# Patient Record
Sex: Female | Born: 1990 | Race: White | Hispanic: No | Marital: Single | Smoking: Never smoker
Health system: Southern US, Community
[De-identification: ages and names within clinical notes are randomized; demographics above are authoritative.]

## PROBLEM LIST (undated history)

## (undated) DIAGNOSIS — E119 Type 2 diabetes mellitus without complications: Secondary | ICD-10-CM

## (undated) DIAGNOSIS — F329 Major depressive disorder, single episode, unspecified: Secondary | ICD-10-CM

## (undated) DIAGNOSIS — F419 Anxiety disorder, unspecified: Secondary | ICD-10-CM

## (undated) DIAGNOSIS — F32A Depression, unspecified: Secondary | ICD-10-CM

## (undated) DIAGNOSIS — G43909 Migraine, unspecified, not intractable, without status migrainosus: Secondary | ICD-10-CM

## (undated) DIAGNOSIS — N809 Endometriosis, unspecified: Secondary | ICD-10-CM

## (undated) HISTORY — DX: Major depressive disorder, single episode, unspecified: F32.9

## (undated) HISTORY — DX: Migraine, unspecified, not intractable, without status migrainosus: G43.909

## (undated) HISTORY — PX: INTRAUTERINE DEVICE (IUD) INSERTION: SHX5877

## (undated) HISTORY — DX: Type 2 diabetes mellitus without complications: E11.9

## (undated) HISTORY — DX: Anxiety disorder, unspecified: F41.9

## (undated) HISTORY — DX: Depression, unspecified: F32.A

## (undated) HISTORY — PX: WISDOM TOOTH EXTRACTION: SHX21

---

## 2013-08-23 ENCOUNTER — Emergency Department (HOSPITAL_COMMUNITY)
Admission: EM | Admit: 2013-08-23 | Discharge: 2013-08-23 | Disposition: A | Discharge status: Home / Self Care | Payer: 59 | Attending: Emergency Medicine | Admitting: Emergency Medicine

## 2013-08-23 ENCOUNTER — Encounter (HOSPITAL_COMMUNITY): Payer: Self-pay | Admitting: Emergency Medicine

## 2013-08-23 DIAGNOSIS — E119 Type 2 diabetes mellitus without complications: Secondary | ICD-10-CM | POA: Insufficient documentation

## 2013-08-23 DIAGNOSIS — Z8719 Personal history of other diseases of the digestive system: Secondary | ICD-10-CM | POA: Insufficient documentation

## 2013-08-23 DIAGNOSIS — R52 Pain, unspecified: Secondary | ICD-10-CM | POA: Insufficient documentation

## 2013-08-23 DIAGNOSIS — R631 Polydipsia: Secondary | ICD-10-CM | POA: Insufficient documentation

## 2013-08-23 DIAGNOSIS — Z3202 Encounter for pregnancy test, result negative: Secondary | ICD-10-CM | POA: Insufficient documentation

## 2013-08-23 DIAGNOSIS — R112 Nausea with vomiting, unspecified: Secondary | ICD-10-CM | POA: Insufficient documentation

## 2013-08-23 DIAGNOSIS — R197 Diarrhea, unspecified: Secondary | ICD-10-CM | POA: Insufficient documentation

## 2013-08-23 DIAGNOSIS — Z79899 Other long term (current) drug therapy: Secondary | ICD-10-CM | POA: Insufficient documentation

## 2013-08-23 LAB — COMPREHENSIVE METABOLIC PANEL
ALK PHOS: 52 U/L (ref 39–117)
ALT: 80 U/L — ABNORMAL HIGH (ref 0–35)
AST: 61 U/L — AB (ref 0–37)
Albumin: 3.5 g/dL (ref 3.5–5.2)
BILIRUBIN TOTAL: 0.4 mg/dL (ref 0.3–1.2)
BUN: 9 mg/dL (ref 6–23)
CHLORIDE: 103 meq/L (ref 96–112)
CO2: 24 mEq/L (ref 19–32)
Calcium: 9 mg/dL (ref 8.4–10.5)
Creatinine, Ser: 0.65 mg/dL (ref 0.50–1.10)
GFR calc Af Amer: >90 mL/min (ref >90)
GFR calc non Af Amer: >90 mL/min (ref >90)
Glucose, Bld: 255 mg/dL — ABNORMAL HIGH (ref 70–99)
POTASSIUM: 3.7 meq/L (ref 3.7–5.3)
Sodium: 140 mEq/L (ref 137–147)
Total Protein: 7.8 g/dL (ref 6.0–8.3)

## 2013-08-23 LAB — CBC WITH DIFFERENTIAL/PLATELET
BASOS ABS: 0 10*3/uL (ref 0.0–0.1)
Basophils Relative: 1 % (ref 0–1)
Eosinophils Absolute: 0 10*3/uL (ref 0.0–0.7)
Eosinophils Relative: 1 % (ref 0–5)
HCT: 41.5 % (ref 36.0–46.0)
Hemoglobin: 14.7 g/dL (ref 12.0–15.0)
Lymphocytes Relative: 50 % — ABNORMAL HIGH (ref 12–46)
Lymphs Abs: 1.6 10*3/uL (ref 0.7–4.0)
MCH: 30.7 pg (ref 26.0–34.0)
MCHC: 35.4 g/dL (ref 30.0–36.0)
MCV: 86.6 fL (ref 78.0–100.0)
MONOS PCT: 12 % (ref 3–12)
Monocytes Absolute: 0.4 10*3/uL (ref 0.1–1.0)
Neutro Abs: 1.2 10*3/uL — ABNORMAL LOW (ref 1.7–7.7)
Neutrophils Relative %: 36 % — ABNORMAL LOW (ref 43–77)
PLATELETS: 204 10*3/uL (ref 150–400)
RBC: 4.79 MIL/uL (ref 3.87–5.11)
RDW: 12.2 % (ref 11.5–15.5)
WBC: 3.2 10*3/uL — ABNORMAL LOW (ref 4.0–10.5)

## 2013-08-23 LAB — URINALYSIS, ROUTINE W REFLEX MICROSCOPIC
BILIRUBIN URINE: NEGATIVE
Glucose, UA: NEGATIVE mg/dL
Hgb urine dipstick: NEGATIVE
KETONES UR: NEGATIVE mg/dL
LEUKOCYTES UA: NEGATIVE
Nitrite: NEGATIVE
PH: 5.5 (ref 5.0–8.0)
Protein, ur: NEGATIVE mg/dL
Specific Gravity, Urine: 1.018 (ref 1.005–1.030)
Urobilinogen, UA: 0.2 mg/dL (ref 0.0–1.0)

## 2013-08-23 LAB — GLUCOSE, CAPILLARY: Glucose-Capillary: 153 mg/dL — ABNORMAL HIGH (ref 70–99)

## 2013-08-23 LAB — LIPASE, BLOOD: Lipase: 21 U/L (ref 11–59)

## 2013-08-23 LAB — POCT PREGNANCY, URINE: Preg Test, Ur: NEGATIVE

## 2013-08-23 MED ORDER — ONDANSETRON HCL 4 MG PO TABS: 4.0000 mg | ORAL_TABLET | Freq: Four times a day (QID) | ORAL | Status: DC

## 2013-08-23 MED ORDER — ONDANSETRON HCL 4 MG/2ML IJ SOLN
4.0000 mg | Freq: Once | INTRAMUSCULAR | Status: AC
  Administered 2013-08-23: 4 mg via INTRAVENOUS
  Filled 2013-08-23: qty 2

## 2013-08-23 MED ORDER — SODIUM CHLORIDE 0.9 % IV BOLUS (SEPSIS)
1000.0000 mL | Freq: Once | INTRAVENOUS | Status: AC
  Administered 2013-08-23: 1000 mL via INTRAVENOUS

## 2013-08-23 NOTE — ED Notes (Signed)
Bed: [id number] Expected date:  Expected time:  Means of arrival:  Comments: For [place]

## 2013-08-23 NOTE — Discharge Instructions (Signed)
Diabetes and Exercise Exercising regularly is important. It is not just about losing weight. It has many health benefits, such as:  Improving your overall fitness, flexibility, and endurance.  Increasing your bone density.  Helping with weight control.  Decreasing your body fat.  Increasing your muscle strength.  Reducing stress and tension.  Improving your overall health. People with diabetes who exercise gain additional benefits because exercise:  Reduces appetite.  Improves the body's use of blood sugar (glucose).  Helps lower or control blood glucose.  Decreases blood pressure.  Helps control blood lipids (such as cholesterol and triglycerides).  Improves the body's use of the hormone insulin by:  Increasing the body's insulin sensitivity.  Reducing the body's insulin needs.  Decreases the risk for heart disease because exercising:  Lowers cholesterol and triglycerides levels.  Increases the levels of good cholesterol (such as high-density lipoproteins [HDL]) in the body.  Lowers blood glucose levels. YOUR ACTIVITY PLAN  Choose an activity that you enjoy and set realistic goals. Your health care provider or diabetes educator can help you make an activity plan that works for you. You can break activities into 2 or 3 sessions throughout the day. Doing so is as good as one long session. Exercise ideas include:  Taking the dog for a walk.  Taking the stairs instead of the elevator.  Dancing to your favorite song.  Doing your favorite exercise with a friend. RECOMMENDATIONS FOR EXERCISING WITH TYPE 1 OR TYPE 2 DIABETES   Check your blood glucose before exercising. If blood glucose levels are greater than 240 mg/dL, check for urine ketones. Do not exercise if ketones are present.  Avoid injecting insulin into areas of the body that are going to be exercised. For example, avoid injecting insulin into:  The arms when playing tennis.  The legs when  jogging.  Keep a record of:  Food intake before and after you exercise.  Expected peak times of insulin action.  Blood glucose levels before and after you exercise.  The type and amount of exercise you have done.  Review your records with your health care provider. Your health care provider will help you to develop guidelines for adjusting food intake and insulin amounts before and after exercising.  If you take insulin or oral hypoglycemic agents, watch for signs and symptoms of hypoglycemia. They include:  Dizziness.  Shaking.  Sweating.  Chills.  Confusion.  Drink plenty of water while you exercise to prevent dehydration or heat stroke. Body water is lost during exercise and must be replaced.  Talk to your health care provider before starting an exercise program to make sure it is safe for you. Remember, almost any type of activity is better than none. Document Released: 09/22/2003 Document Revised: 03/04/2013 Document Reviewed: 12/09/2012 [facility] Patient Information 2014 [place].  Hyperglycemia Hyperglycemia occurs when the glucose (sugar) in your blood is too high. Hyperglycemia can happen for many reasons, but it most often happens to people who do not know they have diabetes or are not managing their diabetes properly.  CAUSES  Whether you have diabetes or not, there are other causes of hyperglycemia. Hyperglycemia can occur when you have diabetes, but it can also occur in other situations that you might not be as aware of, such as: Diabetes  If you have diabetes and are having problems controlling your blood glucose, hyperglycemia could occur because of some of the following reasons:  Not following your meal plan.  Not taking your diabetes medications or not  taking it properly.  Exercising less or doing less activity than you normally do.  Being sick. Pre-diabetes  This cannot be ignored. Before people develop Type 2 diabetes, they almost always have  "pre-diabetes." This is when your blood glucose levels are higher than normal, but not yet high enough to be diagnosed as diabetes. Research has shown that some long-term damage to the body, especially the heart and circulatory system, may already be occurring during pre-diabetes. If you take action to manage your blood glucose when you have pre-diabetes, you may delay or prevent Type 2 diabetes from developing. Stress  If you have diabetes, you may be "diet" controlled or on oral medications or insulin to control your diabetes. However, you may find that your blood glucose is higher than usual in the hospital whether you have diabetes or not. This is often referred to as "stress hyperglycemia." Stress can elevate your blood glucose. This happens because of hormones put out by the body during times of stress. If stress has been the cause of your high blood glucose, it can be followed regularly by your caregiver. That way he/she can make sure your hyperglycemia does not continue to get worse or progress to diabetes. Steroids  Steroids are medications that act on the infection fighting system (immune system) to block inflammation or infection. One side effect can be a rise in blood glucose. Most people can produce enough extra insulin to allow for this rise, but for those who cannot, steroids make blood glucose levels go even higher. It is not unusual for steroid treatments to "uncover" diabetes that is developing. It is not always possible to determine if the hyperglycemia will go away after the steroids are stopped. A special blood test called an A1c is sometimes done to determine if your blood glucose was elevated before the steroids were started. SYMPTOMS  Thirsty.  Frequent urination.  Dry mouth.  Blurred vision.  Tired or fatigue.  Weakness.  Sleepy.  Tingling in feet or leg. DIAGNOSIS  Diagnosis is made by monitoring blood glucose in one or all of the following ways:  A1c test. This  is a chemical found in your blood.  Fingerstick blood glucose monitoring.  Laboratory results. TREATMENT  First, knowing the cause of the hyperglycemia is important before the hyperglycemia can be treated. Treatment may include, but is not be limited to:  Education.  Change or adjustment in medications.  Change or adjustment in meal plan.  Treatment for an illness, infection, etc.  More frequent blood glucose monitoring.  Change in exercise plan.  Decreasing or stopping steroids.  Lifestyle changes. HOME CARE INSTRUCTIONS   Test your blood glucose as directed.  Exercise regularly. Your caregiver will give you instructions about exercise. Pre-diabetes or diabetes which comes on with stress is helped by exercising.  Eat wholesome, balanced meals. Eat often and at regular, fixed times. Your caregiver or nutritionist will give you a meal plan to guide your sugar intake.  Being at an ideal weight is important. If needed, losing as little as 10 to 15 pounds may help improve blood glucose levels. SEEK MEDICAL CARE IF:   You have questions about medicine, activity, or diet.  You continue to have symptoms (problems such as increased thirst, urination, or weight gain). SEEK IMMEDIATE MEDICAL CARE IF:   You are vomiting or have diarrhea.  Your breath smells fruity.  You are breathing faster or slower.  You are very sleepy or incoherent.  You have numbness, tingling, or pain in  your feet or hands.  You have chest pain.  Your symptoms get worse even though you have been following your caregiver's orders.  If you have any other questions or concerns. Document Released: 12/26/2000 Document Revised: 09/24/2011 Document Reviewed: 10/29/2011 [facility] Patient Information 2014 [place], [place].  Nausea and Vomiting Nausea means you feel sick to your stomach. Throwing up (vomiting) is a reflex where stomach contents come out of your mouth. HOME CARE   Take medicine as told by  your doctor.  Do not force yourself to eat. However, you do need to drink fluids.  If you feel like eating, eat a normal diet as told by your doctor.  Eat rice, wheat, potatoes, bread, lean meats, yogurt, fruits, and vegetables.  Avoid high-fat foods.  Drink enough fluids to keep your pee (urine) clear or pale yellow.  Ask your doctor how to replace body fluid losses (rehydrate). Signs of body fluid loss (dehydration) include:  Feeling very thirsty.  Dry lips and mouth.  Feeling dizzy.  Dark pee.  Peeing less than normal.  Feeling confused.  Fast breathing or heart rate. GET HELP RIGHT AWAY IF:   You have blood in your throw up.  You have black or bloody poop (stool).  You have a bad headache or stiff neck.  You feel confused.  You have bad belly (abdominal) pain.  You have chest pain or trouble breathing.  You do not pee at least once every 8 hours.  You have cold, clammy skin.  You keep throwing up after 24 to 48 hours.  You have a fever. MAKE SURE YOU:   Understand these instructions.  Will watch your condition.  Will get help right away if you are not doing well or get worse. Document Released: 12/19/2007 Document Revised: 09/24/2011 Document Reviewed: 12/01/2010 [facility] Patient Information 2014 [place], [place].

## 2013-08-23 NOTE — ED Notes (Signed)
Bedside report received from previous RN, Susan. 

## 2013-08-23 NOTE — ED Provider Notes (Signed)
CSN: [id number]     Arrival date & time 08/23/13  1635 History   First MD Initiated Contact with Patient 08/23/13 1646     Chief Complaint  Patient presents with  . Emesis  . Diarrhea   (Consider location/radiation/quality/duration/timing/severity/associated sxs/prior Treatment) HPI Comments: Patient is a 23 year old female who presents today with one week of nausea, vomiting, diarrhea. She reports that this began on Sunday at that time was associated with body aches and flu like symptoms. [facility] who diagnosed her with a viral gastroenteritis. Since that time the body aches have improved. She is still having the diarrhea, nausea, vomiting. She has abdominal pain relieved by having diarrhea. She has never has sx that have lasted this long in the past. No prior abdominal surgeries. No abnormal vaginal discharge, urinary urgency, frequency, dysuria. She admits to polydipsia, but believes it to be due to the fact she is not keeping anything down. She denies polyuria and polyphagia. No recent travel, suspicious food intake, or recent camping trips. [place].   The history is provided by the patient. No language interpreter was used.    History reviewed. No pertinent past medical history. Past Surgical History  Procedure Laterality Date  . Wisdom tooth extraction     No family history on file. History  Substance Use Topics  . Smoking status: Never Smoker   . Smokeless tobacco: Not on file  . Alcohol Use: No   OB History   Grav Para Term Preterm Abortions TAB SAB Ect Mult Living                 Review of Systems  Constitutional: Negative for fever and chills.  Respiratory: Negative for shortness of breath.   Cardiovascular: Negative for chest pain.  Gastrointestinal: Positive for nausea, vomiting, abdominal pain and diarrhea. Negative for blood in stool.  All other systems reviewed and are  negative.    Allergies  Review of patient's allergies indicates no known allergies.  Home Medications   Current Outpatient Rx  Name  Route  Sig  Dispense  Refill  . bismuth subsalicylate (PEPTO BISMOL) 262 MG/15ML suspension   Oral   Take 30 mLs by mouth every 6 (six) hours as needed.         Marland Kitchen. ibuprofen (ADVIL,MOTRIN) 200 MG tablet   Oral   Take 400 mg by mouth every 6 (six) hours as needed for fever or moderate pain.         Marland Kitchen. Prochlorperazine Maleate (COMPAZINE PO)   Oral   Take 1 tablet by mouth 2 (two) times daily.          BP 125/77  Pulse 88  Temp(Src) 98.5 F (36.9 C) (Oral)  Resp 16  Ht 5\' 6"  (1.676 m)  Wt 245 lb (111.131 kg)  BMI 39.56 kg/m2  SpO2 96%  LMP 08/21/2013 Physical Exam  Nursing note and vitals reviewed. Constitutional: She is oriented to person, place, and time. She appears well-developed and well-nourished. No distress.  HENT:  Head: Normocephalic and atraumatic.  Right Ear: External ear normal.  Left Ear: External ear normal.  Nose: Nose normal.  Mouth/Throat: Oropharynx is clear and moist.  Eyes: Conjunctivae are normal.  Neck: Normal range of motion.  Cardiovascular: Normal rate, regular rhythm and normal heart sounds.   Pulmonary/Chest: Effort normal and breath sounds normal. No stridor. No respiratory distress. She has no wheezes. She has no  rales.  Abdominal: Soft. Bowel sounds are normal. She exhibits no distension. There is no tenderness. There is no rigidity, no rebound and no guarding.  No tenderness to deep palpation  Musculoskeletal: Normal range of motion.  Neurological: She is alert and oriented to person, place, and time. She has normal strength.  Skin: Skin is warm and dry. She is not diaphoretic. No erythema.  Psychiatric: She has a normal mood and affect. Her behavior is normal.    ED Course  Procedures (including critical care time) Labs Review Labs Reviewed  CBC WITH DIFFERENTIAL - Abnormal; Notable for the  following:    WBC 3.2 (*)    Neutrophils Relative % 36 (*)    Lymphocytes Relative 50 (*)    Neutro Abs 1.2 (*)    All other components within normal limits  COMPREHENSIVE METABOLIC PANEL - Abnormal; Notable for the following:    Glucose, Bld 255 (*)    AST 61 (*)    ALT 80 (*)    All other components within normal limits  URINALYSIS, ROUTINE W REFLEX MICROSCOPIC - Abnormal; Notable for the following:    APPearance CLOUDY (*)    All other components within normal limits  GLUCOSE, CAPILLARY - Abnormal; Notable for the following:    Glucose-Capillary 153 (*)    All other components within normal limits  LIPASE, BLOOD  POCT PREGNANCY, URINE   Imaging Review No results found.  EKG Interpretation   None       MDM   1. Diabetes   2. Nausea vomiting and diarrhea    Patient is nontoxic, nonseptic appearing, in no apparent distress.  Patient's pain and other symptoms adequately managed in emergency department.  Fluid bolus given.  Labs, imaging and vitals reviewed.  Patient does not meet the SIRS or Sepsis criteria.  On repeat exam patient does not have a surgical abdomen and there are no peritoneal signs.  No indication of appendicitis, bowel obstruction, bowel perforation, cholecystitis, diverticulitis, PID or ectopic pregnancy.  Pt with glucose of 255. Discussed this diagnosis of diabetes with the patient. Glucose decreased to 153 after fluid bolus. Patient discharged home with symptomatic treatment and given strict instructions for follow-up. She will get a primary care physician. Discussed different options today.  I have also discussed reasons to return immediately to the ER.  Patient expresses understanding and agrees with plan. Discussed case with Dr. Micheline Maze who agrees with plan. Patient / Family / Caregiver informed of clinical course, understand medical decision-making process, and agree with plan.       Mora Bellman, PA-C 08/24/13 [phone]

## 2013-08-23 NOTE — ED Notes (Signed)
Pt presents with NAD- V/D since last Sunday. Fever since last Thursday. However not in the last couple of days. Last episode of vomiting 1 hour ago. 2 episodes of vomiting at that time. Last episode of diarrhea at 5am. 4 episodes of diarrhea in 24 hours. [facility] has has [facility] and was given compazine for nausea. Pepto for other symptoms. Pt has has no improvement. Here for reevaluation.

## 2013-08-24 NOTE — ED Provider Notes (Signed)
Medical screening examination/treatment/procedure(s) were performed by non-physician practitioner and as supervising physician I was immediately available for consultation/collaboration.  EKG Interpretation   None         Shanna CiscoMegan E Docherty, MD 08/24/13 1310

## 2013-10-20 ENCOUNTER — Encounter: Payer: Self-pay | Admitting: Certified Nurse Midwife

## 2013-10-20 ENCOUNTER — Ambulatory Visit (INDEPENDENT_AMBULATORY_CARE_PROVIDER_SITE_OTHER): Payer: 59 | Admitting: Certified Nurse Midwife

## 2013-10-20 VITALS — BP 118/78 | HR 60 | Resp 15 | Ht 65.75 in | Wt 244.0 lb

## 2013-10-20 DIAGNOSIS — G43009 Migraine without aura, not intractable, without status migrainosus: Secondary | ICD-10-CM | POA: Insufficient documentation

## 2013-10-20 DIAGNOSIS — E669 Obesity, unspecified: Secondary | ICD-10-CM

## 2013-10-20 DIAGNOSIS — Z01419 Encounter for gynecological examination (general) (routine) without abnormal findings: Secondary | ICD-10-CM

## 2013-10-20 DIAGNOSIS — Z0000 Encounter for general adult medical examination without abnormal findings: Secondary | ICD-10-CM

## 2013-10-20 NOTE — Progress Notes (Signed)
23 y.o. G0P0000 Single Caucasian Fe here to establish gyn care and  for annual exam. Periods are irregular with Paragard IUD and light first 3 days and then heavy until stops, 4 days later. Chose Paragard due to recommendation, Mirena might affect migraines. Patient happy with choice at this point.. Sexually active no partner change, no STD concerns or testing desired. Sees [facility] for prn use. Last lab work at ER in hospital, all normal except for liver enzymes due to dehydration. Patient experiencing? anxiety attacks again . Previous use of Celexa with good response 3, but  years ago. Denies thoughts of hurting self or others. No other health issues today. Patient works as an Museum/gallery exhibitions officer.   Patient's last menstrual period was 10/20/2013.          Sexually active: yes  The current method of family planning is IUD.    Exercising: no  exercise Smoker:  no  Health Maintenance: Pap:  1/14 No abnormal paps ever MMG:  none Colonoscopy:  none BMD:   none TDaP:  7/14 Labs: none Self breast exam: done monthly   reports that she has never smoked. She does not have any smokeless tobacco history on file. She reports that she does not drink alcohol or use illicit drugs.  Past Medical History  Diagnosis Date  . Migraines   . Anxiety   . Depression   . Diabetes mellitus without complication     Past Surgical History  Procedure Laterality Date  . Wisdom tooth extraction    . Intrauterine device (iud) insertion      paraguard iud inserted 02/2013    Current Outpatient Prescriptions  Medication Sig Dispense Refill  . ibuprofen (ADVIL,MOTRIN) 200 MG tablet Take 400 mg by mouth every 6 (six) hours as needed for fever or moderate pain.      Marland Kitchen loratadine (CLARITIN) 10 MG tablet Take 10 mg by mouth daily.      Marland Kitchen PARAGARD INTRAUTERINE COPPER IU by Intrauterine route.       No current facility-administered medications for this visit.    Family History  Problem Relation Age of Onset  . Diabetes  Mother   . Hypertension Mother   . Bipolar disorder Mother   . Diabetes Brother   . Breast cancer Maternal Aunt   . Diabetes Maternal Grandfather   . Diabetes Paternal Grandfather   . Heart attack Paternal Grandfather   . Breast cancer Maternal Aunt     ROS:  Pertinent items are noted in HPI.  Otherwise, a comprehensive ROS was negative.  Exam:   BP 118/78  Pulse 60  Resp 15  Ht 5' 5.75" (1.67 m)  Wt 244 lb (110.678 kg)  BMI 39.69 kg/m2  LMP 10/20/2013 Height: 5' 5.75" (167 cm)  Ht Readings from Last 3 Encounters:  10/20/13 5' 5.75" (1.67 m)  08/23/13 5\' 6"  (1.676 m)    General appearance: alert, cooperative and appears stated age Head: Normocephalic, without obvious abnormality, atraumatic Neck: no adenopathy, supple, symmetrical, trachea midline and thyroid normal to inspection and palpation and non-palpable Lungs: clear to auscultation bilaterally Breasts: normal appearance, no masses or tenderness, No nipple retraction or dimpling, No nipple discharge or bleeding, No axillary or supraclavicular adenopathy Heart: regular rate and rhythm Abdomen: soft, non-tender; no masses,  no organomegaly Extremities: extremities normal, atraumatic, no cyanosis or edema Skin: Skin color, texture, turgor normal. No rashes or lesions Lymph nodes: Cervical, supraclavicular, and axillary nodes normal. No abnormal inguinal nodes palpated Neurologic: Grossly normal  Pelvic: External genitalia:  no lesions              Urethra:  normal appearing urethra with no masses, tenderness or lesions              Bartholin's and Skene's: normal                 Vagina: normal appearing vagina with normal color and discharge, no lesions              Cervix: normal, non tender, IUD string noted              Pap taken: yes Bimanual Exam:  Uterus:  normal size, contour, position, consistency, mobility, non-tender and anteverted              Adnexa: normal adnexa and no mass, fullness, tenderness                Rectovaginal: Confirms               Anus:  normal sphincter tone, no lesions  A:  Well Woman with normal exam  Contraception Paragard IUD  Obesity  Anxiety with panic attacks  Family history of breast cancer  P:  Reviewed health and wellness pertinent to exam  Aware of warning signs and symptoms with IUD use  Discussed counseling evaluation before considering medication again. Discussed with the work she does, this may be a contributory factor to some of the panic attacks and if medication necessary to make the best choice for her. Patient " thinks this is a great idea", given J.Whitt information to schedule. Patient to call and schedule appointment here if needed after counseling session. If thoughts of self harm or others seek ER or 911. Patient aware.  Patient aware of genetic screening and not sure if it was done with MAunts, will check with mother. Stressed SBE and yearly clinical exam.  Pap smear as per guidelines   pap smear with reflex taken today  counseled on breast self exam, HIV risk factors and prevention, adequate intake of calcium and vitamin D, diet and exercise and weight loss which would increase health benefits. Questions addressed.  return annually or prn  An After Visit Summary was printed and given to the patient.

## 2013-10-20 NOTE — Patient Instructions (Addendum)
General topics  Next pap or exam is  due in 1 year Take a Women's multivitamin Take 1200 mg. of calcium daily - prefer dietary If any concerns in interim to call back  Breast Self-Awareness Practicing breast self-awareness may pick up problems early, prevent significant medical complications, and possibly save your life. By practicing breast self-awareness, you can become familiar with how your breasts look and feel and if your breasts are changing. This allows you to notice changes early. It can also offer you some reassurance that your breast health is good. One way to learn what is normal for your breasts and whether your breasts are changing is to do a breast self-exam. If you find a lump or something that was not present in the past, it is best to contact your caregiver right away. Other findings that should be evaluated by your caregiver include nipple discharge, especially if it is bloody; skin changes or reddening; areas where the skin seems to be pulled in (retracted); or new lumps and bumps. Breast pain is seldom associated with cancer (malignancy), but should also be evaluated by a caregiver. BREAST SELF-EXAM The best time to examine your breasts is 5 7 days after your menstrual period is over.  ExitCare Patient Information 2013 ExitCare[employer].   Exercise to Stay Healthy Exercise helps you become and stay healthy. EXERCISE IDEAS AND TIPS Choose exercises that:  You enjoy.  Fit into your day. You do not need to exercise really hard to be healthy. You can do exercises at a slow or medium level and stay healthy. You can:  Stretch before and after working out.  Try yoga, Pilates, or tai chi.  Lift weights.  Walk fast, swim, jog, run, climb stairs, bicycle, dance, or rollerskate.  Take aerobic classes. Exercises that burn about 150 calories:  Running 1  miles in 15 minutes.  Playing volleyball for 45 to 60 minutes.  Washing and waxing a car for 45 to 60  minutes.  Playing touch football for 45 minutes.  Walking 1  miles in 35 minutes.  Pushing a stroller 1  miles in 30 minutes.  Playing basketball for 30 minutes.  Raking leaves for 30 minutes.  Bicycling 5 miles in 30 minutes.  Walking 2 miles in 30 minutes.  Dancing for 30 minutes.  Shoveling snow for 15 minutes.  Swimming laps for 20 minutes.  Walking up stairs for 15 minutes.  Bicycling 4 miles in 15 minutes.  Gardening for 30 to 45 minutes.  Jumping rope for 15 minutes.  Washing windows or floors for 45 to 60 minutes. Document Released: 08/04/2010 Document Revised: 09/24/2011 Document Reviewed: 08/04/2010 ExitCare Patient Information 2013 ExitCare[employer].   Other topics ( that may be useful information):    Sexually Transmitted Disease Sexually transmitted disease (STD) refers to any infection that is passed from person to person during sexual activity. This may happen by way of saliva, semen, blood, vaginal mucus, or urine. Common STDs include:  Gonorrhea.  Chlamydia.  Syphilis.  HIV/AIDS.  Genital herpes.  Hepatitis B and C.  Trichomonas.  Human papillomavirus (HPV).  Pubic lice. CAUSES  An STD may be spread by bacteria, virus, or parasite. A person can get an STD by:  Sexual intercourse with an infected person.  Sharing sex toys with an infected person.  Sharing needles with an infected person.  Having intimate contact with the genitals, mouth, or rectal areas of an infected person. SYMPTOMS  Some people may not have any symptoms, but   they can still pass the infection to others. Different STDs have different symptoms. Symptoms include:  Painful or bloody urination.  Pain in the pelvis, abdomen, vagina, anus, throat, or eyes.  Skin rash, itching, irritation, growths, or sores (lesions). These usually occur in the genital or anal area.  Abnormal vaginal discharge.  Penile discharge in men.  Soft, flesh-colored skin growths in the  genital or anal area.  Fever.  Pain or bleeding during sexual intercourse.  Swollen glands in the groin area.  Yellow skin and eyes (jaundice). This is seen with hepatitis. DIAGNOSIS  To make a diagnosis, your caregiver may:  Take a medical history.  Perform a physical exam.  Take a specimen (culture) to be examined.  Examine a sample of discharge under a microscope.  Perform blood test TREATMENT   Chlamydia, gonorrhea, trichomonas, and syphilis can be cured with antibiotic medicine.  Genital herpes, hepatitis, and HIV can be treated, but not cured, with prescribed medicines. The medicines will lessen the symptoms.  Genital warts from HPV can be treated with medicine or by freezing, burning (electrocautery), or surgery. Warts may come back.  HPV is a virus and cannot be cured with medicine or surgery.However, abnormal areas may be followed very closely by your caregiver and may be removed from the cervix, vagina, or vulva through office procedures or surgery. If your diagnosis is confirmed, your recent sexual partners need treatment. This is true even if they are symptom-free or have a negative culture or evaluation. They should not have sex until their caregiver says it is okay. HOME CARE INSTRUCTIONS  All sexual partners should be informed, tested, and treated for all STDs.  Take your antibiotics as directed. Finish them even if you start to feel better.  Only take over-the-counter or prescription medicines for pain, discomfort, or fever as directed by your caregiver.  Rest.  Eat a balanced diet and drink enough fluids to keep your urine clear or pale yellow.  Do not have sex until treatment is completed and you have followed up with your caregiver. STDs should be checked after treatment.  Keep all follow-up appointments, Pap tests, and blood tests as directed by your caregiver.  Only use latex condoms and water-soluble lubricants during sexual activity. Do not use  petroleum jelly or oils.  Avoid alcohol and illegal drugs.  Get vaccinated for HPV and hepatitis. If you have not received these vaccines in the past, talk to your caregiver about whether one or both might be right for you.  Avoid risky sex practices that can break the skin. The only way to avoid getting an STD is to avoid all sexual activity.Latex condoms and dental dams (for oral sex) will help lessen the risk of getting an STD, but will not completely eliminate the risk. SEEK MEDICAL CARE IF:   You have a fever.  You have any new or worsening symptoms. Document Released: 09/22/2002 Document Revised: 09/24/2011 Document Reviewed: 09/29/2010 [facility] Patient Information 2013 [place].    Domestic Abuse You are being battered or abused if someone close to you hits, pushes, or physically hurts you in any way. You also are being abused if you are forced into activities. You are being sexually abused if you are forced to have sexual contact of any kind. You are being emotionally abused if you are made to feel worthless or if you are constantly threatened. It is important to remember that help is available. No one has the right to abuse you. PREVENTION OF FURTHER  ABUSE  Learn the warning signs of danger. This varies with situations but may include: the use of alcohol, threats, isolation from friends and family, or forced sexual contact. Leave if you feel that violence is going to occur.  If you are attacked or beaten, report it to the police so the abuse is documented. You do not have to press charges. The police can protect you while you or the attackers are leaving. Get the officer's name and badge number and a copy of the report.  Find someone you can trust and tell them what is happening to you: your caregiver, a nurse, clergy member, close friend or family member. Feeling ashamed is natural, but remember that you have done nothing wrong. No one deserves abuse. Document Released:  06/29/2000 Document Revised: 09/24/2011 Document Reviewed: 09/07/2010 ExitCare Patient Information 2013 [employer]Care[employer].    How Much is Too Much Alcohol? Drinking too much alcohol can cause injury, accidents, and health problems. These types of problems can include:   Car crashes.  Falls.  Family fighting (domestic violence).  Drowning.  Fights.  Injuries.  Burns.  Damage to certain organs.  Having a baby with birth defects. ONE DRINK CAN BE TOO MUCH WHEN YOU ARE:  Working.  Pregnant or breastfeeding.  Taking medicines. Ask your doctor.  Driving or planning to drive. If you or someone you know has a drinking problem, get help from a doctor.  Document Released: 04/28/2009 Document Revised: 09/24/2011 Document Reviewed: 04/28/2009 ExitCare Patient Information 2013 ExitCare[employer].   Smoking Hazards Smoking cigarettes is extremely bad for your health. Tobacco smoke has over 200 known poisons in it. There are over 60 chemicals in tobacco smoke that cause cancer. Some of the chemicals found in cigarette smoke include:   Cyanide.  Benzene.  Formaldehyde.  Methanol (wood alcohol).  Acetylene (fuel used in welding torches).  Ammonia. Cigarette smoke also contains the poisonous gases nitrogen oxide and carbon monoxide.  Cigarette smokers have an increased risk of many serious medical problems and Smoking causes approximately:  90% of all lung cancer deaths in men.  80% of all lung cancer deaths in women.  90% of deaths from chronic obstructive lung disease. Compared with nonsmokers, smoking increases the risk of:  Coronary heart disease by 2 to 4 times.  Stroke by 2 to 4 times.  Men developing lung cancer by 23 times.  Women developing lung cancer by 13 times.  Dying from chronic obstructive lung diseases by 12 times.  . Smoking is the most preventable cause of death and disease in our society.  WHY IS SMOKING ADDICTIVE?  Nicotine is the chemical  agent in tobacco that is capable of causing addiction or dependence.  When you smoke and inhale, nicotine is absorbed rapidly into the bloodstream through your lungs. Nicotine absorbed through the lungs is capable of creating a powerful addiction. Both inhaled and non-inhaled nicotine may be addictive.  Addiction studies of cigarettes and spit tobacco show that addiction to nicotine occurs mainly during the teen years, when young people begin using tobacco products. WHAT ARE THE BENEFITS OF QUITTING?  There are many health benefits to quitting smoking.   Likelihood of developing cancer and heart disease decreases. Health improvements are seen almost immediately.  Blood pressure, pulse rate, and breathing patterns start returning to normal soon after quitting. QUITTING SMOKING   American Lung Association - 1-800-LUNGUSA  American Cancer Society - 1-800-ACS-2345 Document Released: 08/09/2004 Document Revised: 09/24/2011 Document Reviewed: 04/13/2009 ExitCare Patient Information 2013 ExitCare,   LLC.   Stress Management Stress is a state of physical or mental tension that often results from changes in your life or normal routine. Some common causes of stress are:  Death of a loved one.  Injuries or severe illnesses.  Getting fired or changing jobs.  Moving into a new home. Other causes may be:  Sexual problems.  Business or financial losses.  Taking on a large debt.  Regular conflict with someone at home or at work.  Constant tiredness from lack of sleep. It is not just bad things that are stressful. It may be stressful to:  Win the lottery.  Get married.  Buy a new car. The amount of stress that can be easily tolerated varies from person to person. Changes generally cause stress, regardless of the types of change. Too much stress can affect your health. It may lead to physical or emotional problems. Too little stress (boredom) may also become stressful. SUGGESTIONS TO  REDUCE STRESS:  Talk things over with your family and friends. It often is helpful to share your concerns and worries. If you feel your problem is serious, you may want to get help from a professional counselor.  Consider your problems one at a time instead of lumping them all together. Trying to take care of everything at once may seem impossible. List all the things you need to do and then start with the most important one. Set a goal to accomplish 2 or 3 things each day. If you expect to do too many in a single day you will naturally fail, causing you to feel even more stressed.  Do not use alcohol or drugs to relieve stress. Although you may feel better for a short time, they do not remove the problems that caused the stress. They can also be habit forming.  Exercise regularly - at least 3 times per week. Physical exercise can help to relieve that "uptight" feeling and will relax you.  The shortest distance between despair and hope is often a good night's sleep.  Go to bed and get up on time allowing yourself time for appointments without being rushed.  Take a short "time-out" period from any stressful situation that occurs during the day. Close your eyes and take some deep breaths. Starting with the muscles in your face, tense them, hold it for a few seconds, then relax. Repeat this with the muscles in your neck, shoulders, hand, stomach, back and legs.  Take good care of yourself. Eat a balanced diet and get plenty of rest.  Schedule time for having fun. Take a break from your daily routine to relax. HOME CARE INSTRUCTIONS   Call if you feel overwhelmed by your problems and feel you can no longer manage them on your own.  Return immediately if you feel like hurting yourself or someone else. Document Released: 12/26/2000 Document Revised: 09/24/2011 Document Reviewed: 08/18/2007 [facility] Patient Information 2013 [place].  It was great to meet you today.  I look forward to  working with you in the future. Remember to schedule an appointment with me after seeing Almyra Free. Have a great spring.

## 2013-10-21 NOTE — Progress Notes (Signed)
Reviewed personally.  M. Suzanne Jermar Colter, MD.  

## 2013-10-23 LAB — IPS PAP TEST WITH REFLEX TO HPV

## 2013-10-26 ENCOUNTER — Telehealth: Payer: Self-pay | Admitting: Emergency Medicine

## 2013-10-26 NOTE — Telephone Encounter (Signed)
Message left to return call to Makana Rostad at [phone].    

## 2013-10-26 NOTE — Telephone Encounter (Signed)
Spoke with patient. Advised of message from Verner Choleborah S. Leonard CNM. Patient verbalized understanding and will follow up in one year.  Recall entered. Patient is already scheduled for AEX in 09/2014.  Routing to provider for final review. Patient agreeable to disposition. Will close encounter

## 2013-10-26 NOTE — Telephone Encounter (Signed)
Patient returning Tracy's call. °

## 2013-10-26 NOTE — Telephone Encounter (Signed)
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 Very important she keeps this appointment. At age 23 the  HPV may spontaneously clear, that is why a repeat pap in one year is important. Pap recall 08 ------

## 2013-11-03 ENCOUNTER — Ambulatory Visit (INDEPENDENT_AMBULATORY_CARE_PROVIDER_SITE_OTHER): Payer: 59 | Admitting: Endocrinology

## 2013-11-03 ENCOUNTER — Encounter: Payer: Self-pay | Admitting: Endocrinology

## 2013-11-03 VITALS — BP 122/80 | HR 95 | Temp 98.1°F | Ht 65.75 in | Wt 242.0 lb

## 2013-11-03 DIAGNOSIS — K7581 Nonalcoholic steatohepatitis (NASH): Secondary | ICD-10-CM | POA: Insufficient documentation

## 2013-11-03 DIAGNOSIS — K7689 Other specified diseases of liver: Secondary | ICD-10-CM

## 2013-11-03 DIAGNOSIS — E119 Type 2 diabetes mellitus without complications: Secondary | ICD-10-CM | POA: Insufficient documentation

## 2013-11-03 HISTORY — DX: Type 2 diabetes mellitus without complications: E11.9

## 2013-11-03 MED ORDER — CANAGLIFLOZIN 300 MG PO TABS: 1.0000 | ORAL_TABLET | Freq: Every day | ORAL | Status: DC

## 2013-11-03 MED ORDER — PIOGLITAZONE HCL 45 MG PO TABS: 45.0000 mg | ORAL_TABLET | Freq: Every day | ORAL | Status: DC

## 2013-11-03 MED ORDER — METFORMIN HCL ER 500 MG PO TB24: ORAL_TABLET | ORAL | Status: DC

## 2013-11-03 NOTE — Progress Notes (Signed)
Subjective:    Patient ID: Tamara Robles, female    DOB: Dec 08, 1990, 23 y.o.   MRN: [id number]  HPI pt states DM was dx'ed in early 2015, when she presented with a flu-like illness; she has mild if any neuropathy of the lower extremities; he is unaware of any associated chronic complications.  she has never been on insulin.  pt says his diet and exercise are not good.  she has never had severe hypoglycemia or DKA.  She was rx'ed metformin.  She says cbg's are in the 200's.   Past Medical History  Diagnosis Date  . Migraines   . Anxiety   . Depression     Past Surgical History  Procedure Laterality Date  . Wisdom tooth extraction    . Intrauterine device (iud) insertion      paraguard iud inserted 02/2013    History   Social History  . Marital Status: Single    Spouse Name: N/A    Number of Children: N/A  . Years of Education: N/A   Occupational History  . Not on file.   Social History Main Topics  . Smoking status: Never Smoker   . Smokeless tobacco: Never Used  . Alcohol Use: No  . Drug Use: No  . Sexual Activity: Yes    Partners: Male    Birth Control/ Protection: IUD     Comment: paragard   Other Topics Concern  . Not on file   Social History Narrative  . No narrative on file    Current Outpatient Prescriptions on File Prior to Visit  Medication Sig Dispense Refill  . ibuprofen (ADVIL,MOTRIN) 200 MG tablet Take 400 mg by mouth every 6 (six) hours as needed for fever or moderate pain.      Marland Kitchen. loratadine (CLARITIN) 10 MG tablet Take 10 mg by mouth daily.      Marland Kitchen. PARAGARD INTRAUTERINE COPPER IU by Intrauterine route.       No current facility-administered medications on file prior to visit.    No Known Allergies  Family History  Problem Relation Age of Onset  . Diabetes Mother   . Hypertension Mother   . Bipolar disorder Mother   . Diabetes Brother   . Breast cancer Maternal Aunt   . Diabetes Maternal Grandfather   . Diabetes Paternal Grandfather     . Heart attack Paternal Grandfather   . Breast cancer Maternal Aunt     BP 122/80  Pulse 95  Temp(Src) 98.1 F (36.7 C) (Oral)  Ht 5' 5.75" (1.67 m)  Wt 242 lb (109.77 kg)  BMI 39.36 kg/m2  SpO2 96%  LMP 10/20/2013  Review of Systems denies blurry vision, chest pain, sob, n/v, cramps, excessive diaphoresis, memory loss, rhinorrhea, and easy bruising.  She has excessive urination, headache, irregular menses, depression, and weight gain.    Objective:   Physical Exam VS: see vs page GEN: no distress.  Morbid obesity HEAD: head: no deformity eyes: no periorbital swelling, no proptosis external nose and ears are normal mouth: no lesion seen NECK: supple, thyroid is not enlarged CHEST WALL: no deformity LUNGS:  Clear to auscultation CV: reg rate and rhythm, no murmur ABD: abdomen is soft, nontender.  no hepatosplenomegaly.  not distended.  no hernia MUSCULOSKELETAL: muscle bulk and strength are grossly normal.  no obvious joint swelling.  gait is normal and steady PULSES:  no carotid bruit NEURO:  cn 2-12 grossly intact.   readily moves all 4's.  SKIN:  Normal texture  and temperature.  No rash or suspicious lesion is visible.   NODES:  None palpable at the neck PSYCH: alert, well-oriented.  Does not appear anxious nor depressed.     Assessment & Plan:  Type 2 DM: she needs increased rx.  She may need insulin, but she wants to exhaust orals first. Morbid obesity: this complicates the rx of DM. 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

## 2013-11-03 NOTE — Patient Instructions (Addendum)
good diet and exercise habits significanly improve the control of your diabetes.  please let me know if you wish to be referred to a dietician.  high blood sugar is very risky to your health.  you should see an eye doctor every year.  You are at higher than average risk for pneumonia and hepatitis-B.  You should be vaccinated against both.   controlling your blood pressure and cholesterol drastically reduces the damage diabetes does to your body.  this also applies to quitting smoking.  please discuss these with your doctor.  check your blood sugar once a day.  vary the time of day when you check, between before the 3 meals, and at bedtime.  also check if you have symptoms of your blood sugar being too high or too low.  please keep a record of the readings and bring it to your next appointment here.  You can write it on any piece of paper.  please call us sooner if your blood sugar goes below 70, or if you have a lot of readings over 200.   In view of your medical condition, you should avoid pregnancy until we have decided it is safe.   Refer to a weight-loss surgery specialist.  you will receive a phone call, about a day and time for an informational meeting.   i have sent prescriptions to your pharmacy, to add 2 additional medications.   Please come back for a follow-up appointment in 1 month.

## 2013-11-19 ENCOUNTER — Encounter: Payer: Self-pay | Admitting: Endocrinology

## 2013-11-20 ENCOUNTER — Other Ambulatory Visit: Payer: Self-pay | Admitting: Endocrinology

## 2013-11-20 MED ORDER — ONDANSETRON HCL 4 MG PO TABS: 4.0000 mg | ORAL_TABLET | Freq: Three times a day (TID) | ORAL | Status: DC | PRN

## 2013-12-03 ENCOUNTER — Encounter: Payer: Self-pay | Admitting: Endocrinology

## 2013-12-03 ENCOUNTER — Ambulatory Visit (INDEPENDENT_AMBULATORY_CARE_PROVIDER_SITE_OTHER): Payer: 59 | Admitting: Endocrinology

## 2013-12-03 VITALS — BP 114/78 | HR 63 | Temp 97.9°F | Ht 65.75 in | Wt 242.0 lb

## 2013-12-03 DIAGNOSIS — E119 Type 2 diabetes mellitus without complications: Secondary | ICD-10-CM

## 2013-12-03 DIAGNOSIS — Z79899 Other long term (current) drug therapy: Secondary | ICD-10-CM

## 2013-12-03 DIAGNOSIS — K7581 Nonalcoholic steatohepatitis (NASH): Secondary | ICD-10-CM

## 2013-12-03 DIAGNOSIS — K7689 Other specified diseases of liver: Secondary | ICD-10-CM

## 2013-12-03 LAB — BASIC METABOLIC PANEL
BUN: 19 mg/dL (ref 6–23)
CHLORIDE: 107 meq/L (ref 96–112)
CO2: 24 mEq/L (ref 19–32)
Calcium: 9.4 mg/dL (ref 8.4–10.5)
Creatinine, Ser: 0.8 mg/dL (ref 0.4–1.2)
GFR: 94.74 mL/min (ref >60.00)
Glucose, Bld: 103 mg/dL — ABNORMAL HIGH (ref 70–99)
POTASSIUM: 3.8 meq/L (ref 3.5–5.1)
Sodium: 138 mEq/L (ref 135–145)

## 2013-12-03 LAB — HEPATIC FUNCTION PANEL
ALT: 14 U/L (ref 0–35)
AST: 15 U/L (ref 0–37)
Albumin: 3.7 g/dL (ref 3.5–5.2)
Alkaline Phosphatase: 39 U/L (ref 39–117)
BILIRUBIN DIRECT: 0 mg/dL (ref 0.0–0.3)
TOTAL PROTEIN: 7.5 g/dL (ref 6.0–8.3)
Total Bilirubin: 0.3 mg/dL (ref 0.2–1.2)

## 2013-12-03 LAB — HEMOGLOBIN A1C: Hgb A1c MFr Bld: 8.1 % — ABNORMAL HIGH (ref 4.6–6.5)

## 2013-12-03 MED ORDER — BROMOCRIPTINE MESYLATE 2.5 MG PO TABS: 1.2500 mg | ORAL_TABLET | Freq: Every day | ORAL | Status: DC

## 2013-12-03 NOTE — Progress Notes (Signed)
Subjective:    Patient ID: Tamara LarocheJena Robles, female    DOB: 18-Jan-1991, 23 y.o.   MRN: [id number]  HPI pt returns for f/u of type 2 DM (dx'ed in early 2015, when she presented with a flu-like illness; she has mild if any neuropathy of the lower extremities; he is unaware of any associated chronic complications; she has never been on insulin; she has never had severe hypoglycemia or DKA; he was rx'ed metformin and pioglitizone).  no cbg record, but states cbg's are well-controlled.  She reports urinary frequency.  Past Medical History  Diagnosis Date  . Migraines   . Anxiety   . Depression   . Diabetes 11/03/2013    Past Surgical History  Procedure Laterality Date  . Wisdom tooth extraction    . Intrauterine device (iud) insertion      paraguard iud inserted 02/2013    History   Social History  . Marital Status: Single    Spouse Name: N/A    Number of Children: N/A  . Years of Education: N/A   Occupational History  . Not on file.   Social History Main Topics  . Smoking status: Never Smoker   . Smokeless tobacco: Never Used  . Alcohol Use: No  . Drug Use: No  . Sexual Activity: Yes    Partners: Male    Birth Control/ Protection: IUD     Comment: paragard   Other Topics Concern  . Not on file   Social History Narrative  . No narrative on file    Current Outpatient Prescriptions on File Prior to Visit  Medication Sig Dispense Refill  . Canagliflozin (INVOKANA) 300 MG TABS Take 1 tablet (300 mg total) by mouth daily.  90 tablet  3  . ibuprofen (ADVIL,MOTRIN) 200 MG tablet Take 400 mg by mouth every 6 (six) hours as needed for fever or moderate pain.      Marland Kitchen. loratadine (CLARITIN) 10 MG tablet Take 10 mg by mouth daily.      . ondansetron (ZOFRAN) 4 MG tablet Take 1 tablet (4 mg total) by mouth every 8 (eight) hours as needed for nausea or vomiting.  10 tablet  0  . PARAGARD INTRAUTERINE COPPER IU by Intrauterine route.      . pioglitazone (ACTOS) 45 MG tablet Take 1  tablet (45 mg total) by mouth daily.  90 tablet  3   No current facility-administered medications on file prior to visit.    No Known Allergies  Family History  Problem Relation Age of Onset  . Diabetes Mother   . Hypertension Mother   . Bipolar disorder Mother   . Diabetes Brother   . Breast cancer Maternal Aunt   . Diabetes Maternal Grandfather   . Diabetes Paternal Grandfather   . Heart attack Paternal Grandfather   . Breast cancer Maternal Aunt     BP 114/78  Pulse 63  Temp(Src) 97.9 F (36.6 C) (Oral)  Ht 5' 5.75" (1.67 m)  Wt 242 lb (109.77 kg)  BMI 39.36 kg/m2  SpO2 99%   Review of Systems Denies edema.  She has lost a few lbs, due to her efforts.    Objective:   Physical Exam VITAL SIGNS:  See vs page GENERAL: no distress Pulses: dorsalis pedis intact bilat.   Feet: no deformity. normal color and temp.  no edema Skin:  no ulcer on the feet.   Neuro: sensation is intact to touch on the feet    Lab Results  Component  Value Date   HGBA1C 8.1* 12/03/2013   Lab Results  Component Value Date   ALT 14 12/03/2013   AST 15 12/03/2013   ALKPHOS 39 12/03/2013   BILITOT 0.3 12/03/2013      Assessment & Plan:  NASH: better with pioglitizone Morbid obesity: persistent: I did weight-loss surgery letter. DM: moderate exacerbation: we are adding parlodel today.    Patient Instructions  check your blood sugar once a day.  vary the time of day when you check, between before the 3 meals, and at bedtime.  also check if you have symptoms of your blood sugar being too high or too low.  please keep a record of the readings and bring it to your next appointment here.  You can write it on any piece of paper.  please call us sooner if your blood sugar goes below 70, or if you have a lot of readings over 200.   In view of your medical condition, you should avoid pregnancy until we have decided it is safe.   blood tests are being requested for you today.  We'll contact you with  results. Please come back for a follow-up appointment in 3 months.

## 2013-12-03 NOTE — Patient Instructions (Addendum)
check your blood sugar once a day.  vary the time of day when you check, between before the 3 meals, and at bedtime.  also check if you have symptoms of your blood sugar being too high or too low.  please keep a record of the readings and bring it to your next appointment here.  You can write it on any piece of paper.  please call us sooner if your blood sugar goes below 70, or if you have a lot of readings over 200.   In view of your medical condition, you should avoid pregnancy until we have decided it is safe.   blood tests are being requested for you today.  We'll contact you with results. Please come back for a follow-up appointment in 3 months.

## 2013-12-26 ENCOUNTER — Emergency Department (HOSPITAL_COMMUNITY)
Admission: EM | Admit: 2013-12-26 | Discharge: 2013-12-26 | Disposition: A | Discharge status: Home / Self Care | Payer: 59 | Attending: Emergency Medicine | Admitting: Emergency Medicine

## 2013-12-26 ENCOUNTER — Encounter (HOSPITAL_COMMUNITY): Payer: Self-pay | Admitting: Emergency Medicine

## 2013-12-26 DIAGNOSIS — E119 Type 2 diabetes mellitus without complications: Secondary | ICD-10-CM | POA: Insufficient documentation

## 2013-12-26 DIAGNOSIS — M545 Low back pain, unspecified: Secondary | ICD-10-CM

## 2013-12-26 DIAGNOSIS — S161XXA Strain of muscle, fascia and tendon at neck level, initial encounter: Secondary | ICD-10-CM

## 2013-12-26 DIAGNOSIS — IMO0002 Reserved for concepts with insufficient information to code with codable children: Secondary | ICD-10-CM | POA: Insufficient documentation

## 2013-12-26 DIAGNOSIS — Z8679 Personal history of other diseases of the circulatory system: Secondary | ICD-10-CM | POA: Insufficient documentation

## 2013-12-26 DIAGNOSIS — Z79899 Other long term (current) drug therapy: Secondary | ICD-10-CM | POA: Insufficient documentation

## 2013-12-26 DIAGNOSIS — Y9389 Activity, other specified: Secondary | ICD-10-CM | POA: Insufficient documentation

## 2013-12-26 DIAGNOSIS — S139XXA Sprain of joints and ligaments of unspecified parts of neck, initial encounter: Secondary | ICD-10-CM | POA: Insufficient documentation

## 2013-12-26 DIAGNOSIS — Z791 Long term (current) use of non-steroidal anti-inflammatories (NSAID): Secondary | ICD-10-CM | POA: Insufficient documentation

## 2013-12-26 DIAGNOSIS — Y92410 Unspecified street and highway as the place of occurrence of the external cause: Secondary | ICD-10-CM | POA: Insufficient documentation

## 2013-12-26 DIAGNOSIS — G44209 Tension-type headache, unspecified, not intractable: Secondary | ICD-10-CM | POA: Insufficient documentation

## 2013-12-26 DIAGNOSIS — Z8659 Personal history of other mental and behavioral disorders: Secondary | ICD-10-CM | POA: Insufficient documentation

## 2013-12-26 MED ORDER — METOCLOPRAMIDE HCL 5 MG/ML IJ SOLN
10.0000 mg | Freq: Once | INTRAMUSCULAR | Status: DC
  Filled 2013-12-26: qty 2

## 2013-12-26 MED ORDER — KETOROLAC TROMETHAMINE 30 MG/ML IJ SOLN
30.0000 mg | Freq: Once | INTRAMUSCULAR | Status: AC
  Administered 2013-12-26: 30 mg via INTRAVENOUS
  Filled 2013-12-26: qty 1

## 2013-12-26 MED ORDER — HYDROCODONE-ACETAMINOPHEN 5-325 MG PO TABS
1.0000 | ORAL_TABLET | ORAL | Status: DC | PRN
Start: 2013-12-26 — End: 2014-06-25

## 2013-12-26 MED ORDER — METHOCARBAMOL 500 MG PO TABS: 500.0000 mg | ORAL_TABLET | Freq: Two times a day (BID) | ORAL | Status: DC

## 2013-12-26 MED ORDER — METHOCARBAMOL 1000 MG/10ML IJ SOLN
500.0000 mg | Freq: Once | INTRAMUSCULAR | Status: DC
  Filled 2013-12-26: qty 5

## 2013-12-26 MED ORDER — METOCLOPRAMIDE HCL 5 MG/ML IJ SOLN
10.0000 mg | INTRAMUSCULAR | Status: AC
  Administered 2013-12-26: 10 mg via INTRAVENOUS

## 2013-12-26 MED ORDER — KETOROLAC TROMETHAMINE 60 MG/2ML IM SOLN
60.0000 mg | Freq: Once | INTRAMUSCULAR | Status: DC
  Filled 2013-12-26: qty 2

## 2013-12-26 MED ORDER — NAPROXEN 500 MG PO TABS: 500.0000 mg | ORAL_TABLET | Freq: Two times a day (BID) | ORAL | Status: DC

## 2013-12-26 MED ORDER — DIAZEPAM 5 MG/ML IJ SOLN
2.5000 mg | Freq: Once | INTRAMUSCULAR | Status: AC
  Administered 2013-12-26: 2.5 mg via INTRAVENOUS
  Filled 2013-12-26: qty 2
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
## 2013-12-26 NOTE — Discharge Instructions (Signed)
Take naproxen and Robaxin as prescribed. You may take Norco as needed for severe pain. Followup with your primary care doctor to ensure symptoms resolve.  Muscle Strain A muscle strain is an injury that occurs when a muscle is stretched beyond its normal length. Usually a small number of muscle fibers are torn when this happens. Muscle strain is rated in degrees. First-degree strains have the least amount of muscle fiber tearing and pain. Second-degree and third-degree strains have increasingly more tearing and pain.  Usually, recovery from muscle strain takes 1 2 weeks. Complete healing takes 5 6 weeks.  CAUSES  Muscle strain happens when a sudden, violent force placed on a muscle stretches it too far. This may occur with lifting, sports, or a fall.  RISK FACTORS Muscle strain is especially common in athletes.  SIGNS AND SYMPTOMS At the site of the muscle strain, there may be:  Pain.  Bruising.  Swelling.  Difficulty using the muscle due to pain or lack of normal function. DIAGNOSIS  Your health care provider will perform a physical exam and ask about your medical history. TREATMENT  Often, the best treatment for a muscle strain is resting, icing, and applying cold compresses to the injured area.  HOME CARE INSTRUCTIONS   Use the PRICE method of treatment to promote muscle healing during the first 2 3 days after your injury. The PRICE method involves:  Protecting the muscle from being injured again.  Restricting your activity and resting the injured body part.  Icing your injury. To do this, put ice in a plastic bag. Place a towel between your skin and the bag. Then, apply the ice and leave it on from 15 20 minutes each hour. After the third day, switch to moist heat packs.  Apply compression to the injured area with a splint or elastic bandage. Be careful not to wrap it too tightly. This may interfere with blood circulation or increase swelling.  Elevate the injured body part  above the level of your heart as often as you can.  Only take over-the-counter or prescription medicines for pain, discomfort, or fever as directed by your health care provider.  Warming up prior to exercise helps to prevent future muscle strains. SEEK MEDICAL CARE IF:   You have increasing pain or swelling in the injured area.  You have numbness, tingling, or a significant loss of strength in the injured area. MAKE SURE YOU:   Understand these instructions.  Will watch your condition.  Will get help right away if you are not doing well or get worse. Document Released: 07/02/2005 Document Revised: 04/22/2013 Document Reviewed: 01/29/2013 [facility] Patient Information 2014 [employer], [place]. Tension Headache A tension headache is a feeling of pain, pressure, or aching often felt over the front and sides of the head. The pain can be dull or can feel tight (constricting). It is the most common type of headache. Tension headaches are not normally associated with nausea or vomiting and do not get worse with physical activity. Tension headaches can last 30 minutes to several days.  CAUSES  The exact cause is not known, but it may be caused by chemicals and hormones in the brain that lead to pain. Tension headaches often begin after stress, anxiety, or depression. Other triggers may include:  Alcohol.  Caffeine (too much or withdrawal).  Respiratory infections (colds, flu, sinus infections).  Dental problems or teeth clenching.  Fatigue.  Holding your head and neck in one position too long while using a computer. SYMPTOMS  Pressure around the head.   Dull, aching head pain.   Pain felt over the front and sides of the head.   Tenderness in the muscles of the head, neck, and shoulders. DIAGNOSIS  A tension headache is often diagnosed based on:   Symptoms.   Physical examination.   A CT scan or MRI of your head. These tests may be ordered if symptoms are severe or  unusual. TREATMENT  Medicines may be given to help relieve symptoms.  HOME CARE INSTRUCTIONS   Only take over-the-counter or prescription medicines for pain or discomfort as directed by your caregiver.   Lie down in a dark, quiet room when you have a headache.   Keep a journal to find out what may be triggering your headaches. For example, write down:  What you eat and drink.  How much sleep you get.  Any change to your diet or medicines.  Try massage or other relaxation techniques.   Ice packs or heat applied to the head and neck can be used. Use these 3 to 4 times per day for 15 to 20 minutes each time, or as needed.   Limit stress.   Sit up straight, and do not tense your muscles.   Quit smoking if you smoke.  Limit alcohol use.  Decrease the amount of caffeine you drink, or stop drinking caffeine.  Eat and exercise regularly.  Get 7 to 9 hours of sleep, or as recommended by your caregiver.  Avoid excessive use of pain medicine as recurrent headaches can occur.  SEEK MEDICAL CARE IF:   You have problems with the medicines you were prescribed.  Your medicines do not work.  You have a change from the usual headache.  You have nausea or vomiting. SEEK IMMEDIATE MEDICAL CARE IF:   Your headache becomes severe.  You have a fever.  You have a stiff neck.  You have loss of vision.  You have muscular weakness or loss of muscle control.  You lose your balance or have trouble walking.  You feel faint or pass out.  You have severe symptoms that are different from your first symptoms. MAKE SURE YOU:   Understand these instructions.  Will watch your condition.  Will get help right away if you are not doing well or get worse. Document Released: 07/02/2005 Document Revised: 09/24/2011 Document Reviewed: 06/22/2011 [facility] Patient Information 2014 [place], [place].
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
## 2013-12-27 NOTE — ED Provider Notes (Signed)
Medical screening examination/treatment/procedure(s) were performed by non-physician practitioner and as supervising physician I was immediately available for consultation/collaboration.   EKG Interpretation None       Nixon Sparr, MD 12/27/13 0616 

## 2014-02-10 ENCOUNTER — Telehealth: Payer: Self-pay

## 2014-02-10 NOTE — Telephone Encounter (Signed)
Diabetic Bundle. Lvom for pt to call back and schedule appointment.  

## 2014-03-04 ENCOUNTER — Emergency Department (HOSPITAL_COMMUNITY)
Admission: EM | Admit: 2014-03-04 | Discharge: 2014-03-04 | Disposition: A | Discharge status: Home / Self Care | Payer: 59 | Source: Home / Self Care | Attending: Family Medicine | Admitting: Family Medicine

## 2014-03-04 ENCOUNTER — Encounter (HOSPITAL_COMMUNITY): Payer: Self-pay | Admitting: Emergency Medicine

## 2014-03-04 DIAGNOSIS — R110 Nausea: Secondary | ICD-10-CM

## 2014-03-04 DIAGNOSIS — J0100 Acute maxillary sinusitis, unspecified: Secondary | ICD-10-CM

## 2014-03-04 MED ORDER — ONDANSETRON 4 MG PO TBDP: 4.0000 mg | ORAL_TABLET | Freq: Three times a day (TID) | ORAL | Status: DC | PRN

## 2014-03-04 MED ORDER — IPRATROPIUM BROMIDE 0.06 % NA SOLN: 2.0000 | Freq: Four times a day (QID) | NASAL | Status: DC

## 2014-03-04 MED ORDER — AMOXICILLIN-POT CLAVULANATE 875-125 MG PO TABS: 1.0000 | ORAL_TABLET | Freq: Two times a day (BID) | ORAL | Status: DC

## 2014-03-04 MED ORDER — FLUCONAZOLE 150 MG PO TABS: 150.0000 mg | ORAL_TABLET | Freq: Every day | ORAL | Status: DC

## 2014-03-04 NOTE — ED Notes (Signed)
C/o 7 day plus facial pain, secretions increased, minimal relief w OTC medications; diabetc

## 2014-03-04 NOTE — ED Provider Notes (Addendum)
CSN: [id number]     Arrival date & time 03/04/14  0804 History   None    Chief Complaint  Patient presents with  . Facial Pain   (Consider location/radiation/quality/duration/timing/severity/associated sxs/prior Treatment) HPI Sinus pain and pressure: frontal and maxillary. Associated w/ HA. Got worse then no change. started 10 days ago. Yellow colored discharge from nose. Benadryl, claritin, sudafed  w/o relief. Ibuoprofen 200 Q4 w/o much benefit. Sick contacts. DM dx this year. CBG around 120. Denies fevers, CP, sob.    Past Medical History  Diagnosis Date  . Migraines   . Anxiety   . Depression   . Diabetes 11/03/2013   Past Surgical History  Procedure Laterality Date  . Wisdom tooth extraction    . Intrauterine device (iud) insertion      paraguard iud inserted 02/2013   Family History  Problem Relation Age of Onset  . Diabetes Mother   . Hypertension Mother   . Bipolar disorder Mother   . Diabetes Brother   . Breast cancer Maternal Aunt   . Diabetes Maternal Grandfather   . Diabetes Paternal Grandfather   . Heart attack Paternal Grandfather   . Breast cancer Maternal Aunt    History  Substance Use Topics  . Smoking status: Never Smoker   . Smokeless tobacco: Never Used  . Alcohol Use: No   OB History   Grav Para Term Preterm Abortions TAB SAB Ect Mult Living   0 0 0 0 0 0 0 0 0 0      Review of Systems Per HPI with all other pertinent systems negative. \ Allergies  Review of patient's allergies indicates no known allergies.  Home Medications   Prior to Admission medications   Medication Sig Start Date End Date Taking? Authorizing Provider  amoxicillin-clavulanate (AUGMENTIN) 875-125 MG per tablet Take 1 tablet by mouth 2 (two) times daily. 03/04/14   Ozella Rocksavid J Shanine Kreiger, MD  Canagliflozin (INVOKANA) 300 MG TABS Take 1 tablet (300 mg total) by mouth daily. 11/03/13   Romero BellingSean Ellison, MD  fluconazole (DIFLUCAN) 150 MG tablet Take 1 tablet (150 mg total) by mouth  daily. Repeat dose in 3 days 03/04/14   Ozella Rocksavid J Satish Hammers, MD  HYDROcodone-acetaminophen (NORCO/VICODIN) 5-325 MG per tablet Take 1 tablet by mouth every 4 (four) hours as needed for moderate pain or severe pain. 12/26/13   Antony MaduraKelly Humes, PA-C  ibuprofen (ADVIL,MOTRIN) 200 MG tablet Take 400 mg by mouth every 6 (six) hours as needed for fever or moderate pain.    Historical Provider, MD  ipratropium (ATROVENT) 0.06 % nasal spray Place 2 sprays into both nostrils 4 (four) times daily. 03/04/14   Ozella Rocksavid J Christine Schiefelbein, MD  loratadine (CLARITIN) 10 MG tablet Take 10 mg by mouth daily as needed for allergies.     Historical Provider, MD  metFORMIN (GLUCOPHAGE-XR) 500 MG 24 hr tablet Take 500 mg by mouth daily with breakfast. 1 tab daily 11/03/13   Romero BellingSean Ellison, MD  methocarbamol (ROBAXIN) 500 MG tablet Take 1 tablet (500 mg total) by mouth 2 (two) times daily. 12/26/13   Antony MaduraKelly Humes, PA-C  naproxen (NAPROSYN) 500 MG tablet Take 1 tablet (500 mg total) by mouth 2 (two) times daily. 12/26/13   Antony MaduraKelly Humes, PA-C  ondansetron (ZOFRAN-ODT) 4 MG disintegrating tablet Take 1 tablet (4 mg total) by mouth every 8 (eight) hours as needed for nausea or vomiting. 03/04/14   Ozella Rocksavid J Aunesti Pellegrino, MD  PARAGARD INTRAUTERINE COPPER IU 1 each by Intrauterine route continuous.  Historical Provider, MD  pioglitazone (ACTOS) 45 MG tablet Take 1 tablet (45 mg total) by mouth daily. 11/03/13   Romero Belling, MD   BP 153/90  Pulse 68  Temp(Src) 98.1 F (36.7 C) (Oral)  Resp 16  SpO2 100% Physical Exam  Constitutional: She is oriented to person, place, and time. She appears well-developed and well-nourished. No distress.  HENT:  Tonsils 2+ w/o exudate Frontal and maxillary sinus ttp  Eyes: EOM are normal. Pupils are equal, round, and reactive to light.  Neck: Normal range of motion. No thyromegaly present.  Cardiovascular: Normal rate and normal heart sounds.   No murmur heard. Pulmonary/Chest: Effort normal and breath sounds normal. No  respiratory distress.  Abdominal: Soft.  Musculoskeletal: Normal range of motion.  Lymphadenopathy:    She has cervical adenopathy.  Neurological: She is alert and oriented to person, place, and time.  Skin: Skin is warm. No rash noted. She is not diaphoretic.  Psychiatric: She has a normal mood and affect. Her behavior is normal. Judgment and thought content normal.    ED Course  Procedures (including critical care time) Labs Review Labs Reviewed - No data to display  Imaging Review No results found.   MDM   1. Subacute maxillary sinusitis   2. Nausea   HTN:  Sinusitis: ongoing for 10 days in diabetic patient.  - augmentin - intranasal saline and atrovent - ibuprofen 600 Q6 (renal fxn nml)  Nausea: secondary to post nasal drip and infection - zofran ODT  HTN: typically nml per pt. Take sudafed and sick at time of exam. No signs of cardiac ischemia - f/u PCP   Ozella Rocks, MD 03/04/14 6045  Ozella Rocks, MD 03/05/14 2212

## 2014-03-04 NOTE — Discharge Instructions (Signed)
You have a sinus infection that will require antibiotics to clear Please take the augmentin until its gone  Use the diflucan if you develop a yeas infection Please consider using nasal saline to flush out your nasal passages Atrovent will help with the post nasal drip Please come back if you have any further concerns

## 2014-04-06 ENCOUNTER — Encounter: Payer: Self-pay | Admitting: Endocrinology

## 2014-04-19 ENCOUNTER — Ambulatory Visit: Payer: 59 | Admitting: Endocrinology

## 2014-04-19 ENCOUNTER — Telehealth: Payer: Self-pay | Admitting: Endocrinology

## 2014-04-19 DIAGNOSIS — Z0289 Encounter for other administrative examinations: Secondary | ICD-10-CM

## 2014-04-19 NOTE — Telephone Encounter (Signed)
Lvom advising pt to call back and reschedule appointment.  

## 2014-04-19 NOTE — Telephone Encounter (Signed)
Follow up advised. Contact patient and schedule visit in 2-4 weeks. 

## 2014-04-19 NOTE — Telephone Encounter (Signed)
Patient no showed today's appt. Please advise on how to follow up. °A. No follow up necessary. °B. Follow up urgent. Contact patient immediately. °C. Follow up necessary. Contact patient and schedule visit in ___ days. °D. Follow up advised. Contact patient and schedule visit in ____weeks. ° °

## 2014-06-25 ENCOUNTER — Ambulatory Visit (INDEPENDENT_AMBULATORY_CARE_PROVIDER_SITE_OTHER): Payer: 59 | Admitting: Physician Assistant

## 2014-06-25 VITALS — BP 116/74 | HR 101 | Temp 98.5°F | Resp 20 | Ht 65.0 in | Wt 233.0 lb

## 2014-06-25 DIAGNOSIS — J351 Hypertrophy of tonsils: Secondary | ICD-10-CM

## 2014-06-25 DIAGNOSIS — J069 Acute upper respiratory infection, unspecified: Secondary | ICD-10-CM

## 2014-06-25 LAB — POCT RAPID STREP A (OFFICE): RAPID STREP A SCREEN: NEGATIVE

## 2014-06-25 MED ORDER — GUAIFENESIN ER 1200 MG PO TB12
1.0000 | ORAL_TABLET | Freq: Two times a day (BID) | ORAL | Status: DC | PRN
Start: 2014-06-25 — End: 2016-01-16

## 2014-06-25 MED ORDER — IBUPROFEN 800 MG PO TABS: ORAL_TABLET | ORAL | Status: DC

## 2014-06-25 MED ORDER — ACETAMINOPHEN 500 MG PO TABS: 1000.0000 mg | ORAL_TABLET | Freq: Three times a day (TID) | ORAL | Status: DC | PRN

## 2014-06-25 NOTE — Progress Notes (Signed)
IDENTIFYING INFORMATION  Tamara Robles / DOB: [birth year] / MRN: [id number]  The patient has Migraine headache without aura; Obesity, unspecified; NASH (nonalcoholic steatohepatitis); Diabetes; and Encounter for long-term (current) use of other medications on her problem list.  SUBJECTIVE  CC: Nasal Congestion; Sore Throat; Generalized Body Aches; and Chills   HPI: Tamara Robles is a 23 y.o. y.o. female presenting for four days of rhinorrhea. She developed sore throat that started 3 days ago, and she is able to tolerate food and drink without difficulty.   She reports that the aches and chills started yesterday morning.  She is exposed to sick contacts through her work as a [employer].  She has tried Dayquil and ibuprofen 800 mg q8 with little to no improvement.  She has a history of allergies, for which she take Claritin which works well for her. She has had the flu shot this year.     She reports a history of tonsillar problems, including over 10 strep infections with the last being in February of 15 along with a peritonsillar abscess in 2011.  She also reports that she snores. She would like to be evaluated by ENT for the possibility of surgery to remove her tonsils.  She does not have a PCP, and moved to [place] in January of 2015.     She  has a past medical history of Migraines; Anxiety; Depression; and Diabetes (11/03/2013).    She has a current medication list which includes the following prescription(s): canagliflozin, loratadine, metformin, copper, and pioglitazone.  Tamara Robles has No Known Allergies. She  reports that she has never smoked. She has never used smokeless tobacco. She reports that she does not drink alcohol or use illicit drugs. She  reports that she currently engages in sexual activity and has had female partners. She reports using the following method of birth control/protection: IUD.  The patient  has past surgical history that includes Wisdom tooth extraction and  Intrauterine device (iud) insertion.  Her family history includes Bipolar disorder in her mother; Breast cancer in her maternal aunt and maternal aunt; Diabetes in her brother, maternal grandfather, mother, and paternal grandfather; Heart attack in her paternal grandfather; Hypertension in her mother.  Review of Systems  Constitutional: Positive for fever, chills and malaise/fatigue.  HENT: Positive for congestion, ear pain and sore throat. Negative for ear discharge.   Eyes: Negative.   Respiratory: Negative for cough.   Cardiovascular: Negative for chest pain, palpitations, leg swelling and PND.  Gastrointestinal: Positive for nausea. Negative for abdominal pain, diarrhea and constipation.  Genitourinary: Negative.   Musculoskeletal: Positive for myalgias.  Skin: Negative.   Neurological: Positive for dizziness, weakness and headaches.    OBJECTIVE  Blood pressure 116/74, pulse 101, temperature 98.5 F (36.9 C), resp. rate 20, height 5\' 5"  (1.651 m), weight 233 lb (105.688 kg), last menstrual period 06/21/2014, SpO2 100 %. The patient's body mass index is 38.77 kg/(m^2).  Physical Exam  Constitutional: She appears well-developed and well-nourished.  HENT:  Right Ear: Hearing, tympanic membrane, external ear and ear canal normal. No drainage. Tympanic membrane is not injected, not scarred, not perforated and not bulging. No middle ear effusion. No decreased hearing is noted.  Left Ear: Tympanic membrane, external ear and ear canal normal. No drainage. Tympanic membrane is not injected, not scarred, not perforated and not bulging.  No middle ear effusion. No decreased hearing is noted.  Nose: Mucosal edema (left greater than right) and sinus tenderness present. No rhinorrhea.  Right sinus exhibits maxillary sinus tenderness and frontal sinus tenderness. Left sinus exhibits maxillary sinus tenderness and frontal sinus tenderness.  Mouth/Throat: Uvula is midline. Mucous membranes are not pale,  not dry and not cyanotic. Posterior oropharyngeal erythema present. No oropharyngeal exudate, posterior oropharyngeal edema or tonsillar abscesses.    Lymphadenopathy:       Head (right side): Tonsillar adenopathy present. No submental, no submandibular, no preauricular, no posterior auricular and no occipital adenopathy present.       Head (left side): Tonsillar adenopathy present. No submental, no submandibular, no preauricular, no posterior auricular and no occipital adenopathy present.    She has no cervical adenopathy.  Psychiatric: She has a normal mood and affect. Her speech is normal and behavior is normal. Judgment and thought content normal. Cognition and memory are normal.    Results for orders placed or performed in visit on 06/25/14 (from the past 24 hour(s))  POCT rapid strep A     Status: None   Collection Time: 06/25/14  3:43 PM  Result Value Ref Range   Rapid Strep A Screen Negative Negative    ASSESSMENT & PLAN  Tamara Robles was seen today for nasal congestion, sore throat, generalized body aches and chills.  Diagnoses and associated orders for this visit:  Enlarged tonsils - Ambulatory referral to ENT  Viral URI - ibuprofen (ADVIL,MOTRIN) 800 MG tablet; Take 1 tab every eight hours for pain - Guaifenesin (MUCINEX MAXIMUM STRENGTH) 1200 MG TB12; Take 1 tablet (1,200 mg total) by mouth every 12 (twelve) hours as needed. - POCT rapid strep A - Culture, Group A Strep - acetaminophen (TYLENOL) 500 MG tablet; Take 2 tablets (1,000 mg total) by mouth every 8 (eight) hours as needed. Take 2 tabs every 8 hours. -     Patient educated on the use of pseudoephedrine, and elected to use her Claritin for decongestion. -     Advised patient to call back after 5 days if she is not improved. At that time I am comfortable with treating for bacterial sinusitis with Z-pak or amoxil.      The patient was instructed to to call or comeback to clinic as needed, or should symptoms  warrant.  Deliah BostonMichael Clark, MHS, PA-C [facility] 06/25/2014 3:52 PM

## 2014-06-27 LAB — CULTURE, GROUP A STREP: ORGANISM ID, BACTERIA: NORMAL

## 2014-07-08 ENCOUNTER — Emergency Department (HOSPITAL_COMMUNITY): Payer: 59

## 2014-07-08 ENCOUNTER — Encounter (HOSPITAL_COMMUNITY): Payer: Self-pay | Admitting: Emergency Medicine

## 2014-07-08 ENCOUNTER — Emergency Department (HOSPITAL_COMMUNITY)
Admission: EM | Admit: 2014-07-08 | Discharge: 2014-07-08 | Disposition: A | Discharge status: Home / Self Care | Payer: 59 | Attending: Emergency Medicine | Admitting: Emergency Medicine

## 2014-07-08 DIAGNOSIS — R630 Anorexia: Secondary | ICD-10-CM | POA: Insufficient documentation

## 2014-07-08 DIAGNOSIS — R1012 Left upper quadrant pain: Secondary | ICD-10-CM | POA: Insufficient documentation

## 2014-07-08 DIAGNOSIS — Z79899 Other long term (current) drug therapy: Secondary | ICD-10-CM | POA: Diagnosis not present

## 2014-07-08 DIAGNOSIS — E1165 Type 2 diabetes mellitus with hyperglycemia: Secondary | ICD-10-CM | POA: Insufficient documentation

## 2014-07-08 DIAGNOSIS — R1032 Left lower quadrant pain: Secondary | ICD-10-CM | POA: Diagnosis not present

## 2014-07-08 DIAGNOSIS — Z8659 Personal history of other mental and behavioral disorders: Secondary | ICD-10-CM | POA: Diagnosis not present

## 2014-07-08 DIAGNOSIS — R824 Acetonuria: Secondary | ICD-10-CM | POA: Diagnosis not present

## 2014-07-08 DIAGNOSIS — R739 Hyperglycemia, unspecified: Secondary | ICD-10-CM

## 2014-07-08 DIAGNOSIS — R112 Nausea with vomiting, unspecified: Secondary | ICD-10-CM | POA: Diagnosis present

## 2014-07-08 DIAGNOSIS — Z8679 Personal history of other diseases of the circulatory system: Secondary | ICD-10-CM | POA: Diagnosis not present

## 2014-07-08 DIAGNOSIS — R197 Diarrhea, unspecified: Secondary | ICD-10-CM | POA: Insufficient documentation

## 2014-07-08 LAB — COMPREHENSIVE METABOLIC PANEL
ALK PHOS: 58 U/L (ref 39–117)
ALT: 14 U/L (ref 0–35)
AST: 16 U/L (ref 0–37)
Albumin: 3.9 g/dL (ref 3.5–5.2)
Anion gap: 9 (ref 5–15)
BUN: 15 mg/dL (ref 6–23)
CO2: 23 mmol/L (ref 19–32)
Calcium: 9 mg/dL (ref 8.4–10.5)
Chloride: 109 mEq/L (ref 96–112)
Creatinine, Ser: 0.76 mg/dL (ref 0.50–1.10)
GFR calc non Af Amer: >90 mL/min (ref >90)
GLUCOSE: 211 mg/dL — AB (ref 70–99)
POTASSIUM: 3.5 mmol/L (ref 3.5–5.1)
Sodium: 141 mmol/L (ref 135–145)
TOTAL PROTEIN: 7.4 g/dL (ref 6.0–8.3)
Total Bilirubin: 0.6 mg/dL (ref 0.3–1.2)

## 2014-07-08 LAB — CBC WITH DIFFERENTIAL/PLATELET
Basophils Absolute: 0 10*3/uL (ref 0.0–0.1)
Basophils Relative: 0 % (ref 0–1)
Eosinophils Absolute: 0 10*3/uL (ref 0.0–0.7)
Eosinophils Relative: 0 % (ref 0–5)
HCT: 43.4 % (ref 36.0–46.0)
Hemoglobin: 14.7 g/dL (ref 12.0–15.0)
LYMPHS ABS: 1.3 10*3/uL (ref 0.7–4.0)
LYMPHS PCT: 15 % (ref 12–46)
MCH: 30.6 pg (ref 26.0–34.0)
MCHC: 33.9 g/dL (ref 30.0–36.0)
MCV: 90.2 fL (ref 78.0–100.0)
Monocytes Absolute: 0.6 10*3/uL (ref 0.1–1.0)
Monocytes Relative: 6 % (ref 3–12)
Neutro Abs: 7.1 10*3/uL (ref 1.7–7.7)
Neutrophils Relative %: 79 % — ABNORMAL HIGH (ref 43–77)
PLATELETS: 249 10*3/uL (ref 150–400)
RBC: 4.81 MIL/uL (ref 3.87–5.11)
RDW: 12.7 % (ref 11.5–15.5)
WBC: 9 10*3/uL (ref 4.0–10.5)

## 2014-07-08 LAB — URINALYSIS, ROUTINE W REFLEX MICROSCOPIC
Bilirubin Urine: NEGATIVE
HGB URINE DIPSTICK: NEGATIVE
Ketones, ur: 15 mg/dL — AB
Leukocytes, UA: NEGATIVE
Nitrite: NEGATIVE
PH: 7 (ref 5.0–8.0)
Protein, ur: NEGATIVE mg/dL
Specific Gravity, Urine: 1.04 — ABNORMAL HIGH (ref 1.005–1.030)
Urobilinogen, UA: 0.2 mg/dL (ref 0.0–1.0)

## 2014-07-08 LAB — URINE MICROSCOPIC-ADD ON

## 2014-07-08 LAB — LIPASE, BLOOD: Lipase: 18 U/L (ref 11–59)

## 2014-07-08 LAB — POC URINE PREG, ED: PREG TEST UR: NEGATIVE

## 2014-07-08 MED ORDER — SODIUM CHLORIDE 0.9 % IV BOLUS (SEPSIS)
1000.0000 mL | Freq: Once | INTRAVENOUS | Status: AC
  Administered 2014-07-08: 1000 mL via INTRAVENOUS

## 2014-07-08 MED ORDER — ONDANSETRON HCL 4 MG/2ML IJ SOLN
4.0000 mg | Freq: Once | INTRAMUSCULAR | Status: AC
  Administered 2014-07-08: 4 mg via INTRAVENOUS
  Filled 2014-07-08: qty 2

## 2014-07-08 MED ORDER — METOCLOPRAMIDE HCL 5 MG/ML IJ SOLN
10.0000 mg | Freq: Once | INTRAMUSCULAR | Status: AC
  Administered 2014-07-08: 10 mg via INTRAVENOUS
  Filled 2014-07-08: qty 2

## 2014-07-08 MED ORDER — METOCLOPRAMIDE HCL 10 MG PO TABS: 10.0000 mg | ORAL_TABLET | Freq: Four times a day (QID) | ORAL | Status: DC | PRN

## 2014-07-08 MED ORDER — KETOROLAC TROMETHAMINE 30 MG/ML IJ SOLN
30.0000 mg | Freq: Once | INTRAMUSCULAR | Status: AC
  Administered 2014-07-08: 30 mg via INTRAVENOUS
  Filled 2014-07-08: qty 1

## 2014-07-08 MED ORDER — SODIUM CHLORIDE 0.9 % IV SOLN
1000.0000 mL | Freq: Once | INTRAVENOUS | Status: AC
  Administered 2014-07-08: 1000 mL via INTRAVENOUS

## 2014-07-08 NOTE — Discharge Instructions (Signed)
As discussed, it is important that you get plenty of rest, drink plenty of fluids and take all medication as directed.  Do not hesitate to return here for concerning changes in your condition.

## 2014-07-08 NOTE — ED Notes (Signed)
Assumed care of patient Patient alert and oriented x 4 Patient denies c/o pain and states nausea has subsided Patient denies further needs at this time Call bell in reach

## 2014-07-08 NOTE — ED Notes (Signed)
Pt arrived to the ED with a complaint of abdominal , left flank and back pain.  Pt has experienced pain since 5pm theis afternoon.  Pt has had emesis and diarrhea as well.

## 2014-07-08 NOTE — ED Provider Notes (Signed)
CSN: [id number]     Arrival date & time 07/08/14  2053 History   First MD Initiated Contact with Patient 07/08/14 2105     Chief Complaint  Patient presents with  . Emesis  . Diarrhea     HPI  Patient presents with nausea, vomiting, left flank pain.  Symptoms began approximately 4 hours ago.  Since onset symptoms of been persistent, with anorexia, by mouth intolerance.  No associated urinary changes, nor fever, chills. Patient was well prior to the onset of symptoms. She has had 2 episodes of loose stool since his symptoms began. She denies recent health changes, medication changes, diet changes.   Past Medical History  Diagnosis Date  . Migraines   . Anxiety   . Depression   . Diabetes 11/03/2013   Past Surgical History  Procedure Laterality Date  . Wisdom tooth extraction    . Intrauterine device (iud) insertion      paraguard iud inserted 02/2013   Family History  Problem Relation Age of Onset  . Diabetes Mother   . Hypertension Mother   . Bipolar disorder Mother   . Diabetes Brother   . Breast cancer Maternal Aunt   . Diabetes Maternal Grandfather   . Diabetes Paternal Grandfather   . Heart attack Paternal Grandfather   . Breast cancer Maternal Aunt    History  Substance Use Topics  . Smoking status: Never Smoker   . Smokeless tobacco: Never Used  . Alcohol Use: No   OB History    Gravida Para Term Preterm AB TAB SAB Ectopic Multiple Living   0 0 0 0 0 0 0 0 0 0      Review of Systems  Constitutional:       Per HPI, otherwise negative  HENT:       Per HPI, otherwise negative  Respiratory:       Per HPI, otherwise negative  Cardiovascular:       Per HPI, otherwise negative  Gastrointestinal: Positive for nausea, vomiting, abdominal pain and diarrhea.  Endocrine:       Negative aside from HPI  Genitourinary:       Neg aside from HPI   Musculoskeletal:       Per HPI, otherwise negative  Skin: Negative.   Neurological: Negative for syncope.       Allergies  Review of patient's allergies indicates no known allergies.  Home Medications   Prior to Admission medications   Medication Sig Start Date End Date Taking? Authorizing Provider  acetaminophen (TYLENOL) 500 MG tablet Take 2 tablets (1,000 mg total) by mouth every 8 (eight) hours as needed. Take 2 tabs every 8 hours. 06/25/14   Dolores Lory, PA-C  Canagliflozin (INVOKANA) 300 MG TABS Take 1 tablet (300 mg total) by mouth daily. 11/03/13   Romero Belling, MD  Guaifenesin [facility] MAXIMUM STRENGTH) 1200 MG TB12 Take 1 tablet (1,200 mg total) by mouth every 12 (twelve) hours as needed. 06/25/14   Dolores Lory, PA-C  ibuprofen (ADVIL,MOTRIN) 800 MG tablet Take 1 tab every eight hours for pain 06/25/14   Dolores Lory, PA-C  loratadine (CLARITIN) 10 MG tablet Take 10 mg by mouth daily as needed for allergies.     Historical Provider, MD  metFORMIN (GLUCOPHAGE-XR) 500 MG 24 hr tablet Take 500 mg by mouth daily with breakfast. 1 tab daily 11/03/13   Romero Belling, MD  [facility] INTRAUTERINE COPPER IU 1 each by Intrauterine route continuous.     Historical Provider,  MD  pioglitazone (ACTOS) 45 MG tablet Take 1 tablet (45 mg total) by mouth daily. 11/03/13   Romero BellingSean Ellison, MD   BP 138/90 mmHg  Pulse 103  Temp(Src) 97.7 F (36.5 C) (Oral)  Resp 20  Ht 5\' 6"  (1.676 m)  Wt 230 lb (104.327 kg)  BMI 37.14 kg/m2  SpO2 100%  LMP 06/19/2014 (Exact Date) Physical Exam  Constitutional: She is oriented to person, place, and time. She appears well-developed and well-nourished. No distress.  HENT:  Head: Normocephalic and atraumatic.  Eyes: Conjunctivae and EOM are normal.  Cardiovascular: Normal rate and regular rhythm.   Pulmonary/Chest: Effort normal and breath sounds normal. No stridor. No respiratory distress.  Abdominal: She exhibits no distension. There is tenderness in the left upper quadrant and left lower quadrant. There is CVA tenderness. There is no rigidity and no  guarding.  Musculoskeletal: She exhibits no edema.  Neurological: She is alert and oriented to person, place, and time. No cranial nerve deficit.  Skin: Skin is warm and dry.  Psychiatric: She has a normal mood and affect.  Nursing note and vitals reviewed.   ED Course  Procedures (including critical care time) Labs Review Labs Reviewed  CBC WITH DIFFERENTIAL - Abnormal; Notable for the following:    Neutrophils Relative % 79 (*)    All other components within normal limits  COMPREHENSIVE METABOLIC PANEL - Abnormal; Notable for the following:    Glucose, Bld 211 (*)    All other components within normal limits  URINALYSIS, ROUTINE W REFLEX MICROSCOPIC - Abnormal; Notable for the following:    APPearance CLOUDY (*)    Specific Gravity, Urine 1.040 (*)    Glucose, UA >1000 (*)    Ketones, ur 15 (*)    All other components within normal limits  URINE MICROSCOPIC-ADD ON - Abnormal; Notable for the following:    Squamous Epithelial / LPF MANY (*)    All other components within normal limits  LIPASE, BLOOD  POC URINE PREG, ED    Imaging Review Ct Renal Stone Study  07/08/2014   CLINICAL DATA:  Acute onset of generalized abdominal pain, and left flank and back pain. Vomiting and diarrhea. Initial encounter.  EXAM: CT ABDOMEN AND PELVIS WITHOUT CONTRAST  TECHNIQUE: Multidetector CT imaging of the abdomen and pelvis was performed following the standard protocol without IV contrast.  COMPARISON:  None.  FINDINGS: The visualized lung bases are clear.  The liver and spleen are unremarkable in appearance. The gallbladder is within normal limits. The pancreas and adrenal glands are unremarkable.  The kidneys are unremarkable in appearance. There is no evidence of hydronephrosis. No renal or ureteral stones are seen. No perinephric stranding is appreciated.  No free fluid is identified. The small bowel is unremarkable in appearance. The stomach is within normal limits. No acute vascular  abnormalities are seen.  The appendix is normal in caliber, without evidence for appendicitis. The colon is unremarkable in appearance.  The bladder is mildly distended and grossly unremarkable. The uterus is normal in appearance. The patient's intrauterine device is in abnormally low position, with one of the prongs extending into the myometrium. The ovaries are relatively symmetric, left slightly larger than right, without evidence for suspicious adnexal mass. No inguinal lymphadenopathy is seen.  No acute osseous abnormalities are identified.  IMPRESSION: 1. No acute abnormality seen within the abdomen or pelvis. 2. Abnormal position of intrauterine device, noted at the lower uterine segment and cervix, with one of the prongs extending into  the myometrium.   Electronically Signed   By: Roanna RaiderJeffery  Chang M.D.   On: 07/08/2014 22:19      Update: Patient is feeling better after initial bolus of fluids, Reglan, Toradol. Labs notable for ketonuria, mild hyperglycemia, no anion gap.  MDM   Patient presents with abdominal pain, nausea, vomiting, diarrhea. With hematuria, left flank pain, there was concern for either GI or GU pathology initially. Patient's CT scan does not demonstrate acute findings, patient improved substantially after fluid rehydration, antiemetics, analgesics. Patient d/c in stable condition.    Gerhard Munchobert Rapheal Masso, MD 07/08/14 2325

## 2014-07-08 NOTE — ED Notes (Signed)
Awake. Verbally responsive. A/O x4. Resp even and unlabored. No audible adventitious breath sounds noted. ABC's intact. NAD noted. 

## 2014-07-13 ENCOUNTER — Encounter: Payer: Self-pay | Admitting: Certified Nurse Midwife

## 2014-07-14 ENCOUNTER — Other Ambulatory Visit: Payer: Self-pay | Admitting: Obstetrics and Gynecology

## 2014-07-14 ENCOUNTER — Telehealth: Payer: Self-pay

## 2014-07-14 DIAGNOSIS — T8332XA Displacement of intrauterine contraceptive device, initial encounter: Secondary | ICD-10-CM

## 2014-07-14 NOTE — Telephone Encounter (Signed)
Left message to call [name] at [phone]. 

## 2014-07-14 NOTE — Telephone Encounter (Signed)
Spoke with patient. Advised patient spoke with Dr.Silva and patient can be [facility] or here in our office on Jan 7th at 4pm. Patient states she has been having some abdominal discomfort and would [facility]. [facility] is 07/22/14 at 1:15pm. Spoke with patient. Patient is agreeable to date and time of imaging. Patient placed in imaging hold.  Routing to provider for final review. Patient agreeable to disposition. Will close encounter

## 2014-07-14 NOTE — Telephone Encounter (Signed)
-----   Message from [place] Ghaziarvalho E Silva, MD sent at 07/14/2014  6:43 AM EST ----- Regarding: Please schedule pelvic ultrasound Please schedule an ultrasound for Tamara Robles's patient who has an IUD extending into the myometrium of the uterus.  This was noted on a CT scan.   I recommend ultrasound this week.   It would be idea for her to have an office ultrasound so that I can actually see the images personally with the technician.   Thank you,  Conley SimmondsBrook Silva

## 2014-07-14 NOTE — Telephone Encounter (Signed)
Returning call.

## 2014-07-14 NOTE — Telephone Encounter (Signed)
Spoke with patient. Advised patient of message as seen below from Dr.Silva. Patient is agreeable and would like to schedule at this time. Patient requesting appointment before January 14th as she will be starting a new job on 1/11 and is not able to miss any training. Advised patient will need to speak with provider and return call with scheduling options. Patient is agreeable and verbalizes understanding.

## 2014-07-20 ENCOUNTER — Telehealth: Payer: Self-pay | Admitting: Certified Nurse Midwife

## 2014-07-20 NOTE — Telephone Encounter (Signed)
Left message for patient to call back. Need to go over benefit for IUD removal. Pr $0

## 2014-07-20 NOTE — Telephone Encounter (Signed)
Spoke with patient. Advised that per benefit quote received, IUD removal will be covered at 100% of allowable. Patient agreeable and will call back to schedule.

## 2014-07-20 NOTE — Telephone Encounter (Signed)
Returning a call to Sabrina. °

## 2014-07-22 ENCOUNTER — Ambulatory Visit (HOSPITAL_COMMUNITY)
Admission: RE | Admit: 2014-07-22 | Discharge: 2014-07-22 | Disposition: A | Discharge status: Home / Self Care | Payer: 59 | Source: Ambulatory Visit | Attending: Obstetrics and Gynecology | Admitting: Obstetrics and Gynecology

## 2014-07-22 ENCOUNTER — Other Ambulatory Visit: Payer: Self-pay | Admitting: Obstetrics and Gynecology

## 2014-07-22 DIAGNOSIS — T8332XA Displacement of intrauterine contraceptive device, initial encounter: Secondary | ICD-10-CM

## 2014-07-22 DIAGNOSIS — T8389XA Other specified complication of genitourinary prosthetic devices, implants and grafts, initial encounter: Secondary | ICD-10-CM | POA: Insufficient documentation

## 2014-07-23 ENCOUNTER — Telehealth: Payer: Self-pay | Admitting: Certified Nurse Midwife

## 2014-07-23 DIAGNOSIS — Z30430 Encounter for insertion of intrauterine contraceptive device: Secondary | ICD-10-CM

## 2014-07-23 NOTE — Telephone Encounter (Signed)
Spoke with patient and message from Dr. Edward JollySilva given.    Patient would like to plan for iud removal and replacement.  Patient has paragard IUD and has no preference for replacement.   Patient has current work obligations/trainings and cannot take off work or will lose new job. Does have off on 08/02/14, would like to schedule for this day if possible.   Order placed for precert of iud insertion.  Order placed for follow up Pelvic ultrasound.   Spoke with Billie RuddySally Yeakley, RN to obtain appointment, she will discuss with Dr. Edward JollySilva.   Dr. Edward JollySilva, okay to order Cytotec 200 mcg 1 tablet po the night before and day of procedure for removal/reinsertion?

## 2014-07-23 NOTE — Telephone Encounter (Signed)
-----   Message from [place] Ghaziarvalho E Silva, MD sent at 07/22/2014  6:38 PM EST ----- Please contact patient to schedule a visit in the office to discuss her pelvic ultrasound and remove her IUD.  The IUD is low in the uterus and down into the cervix.   She also has a complex cyst of the left ovary that will need follow up in 6 weeks.  The removal of the IUD has been precerted already.

## 2014-07-23 NOTE — Telephone Encounter (Signed)
Pt says [facility] yesterday and would like to speak with nurse.

## 2014-07-24 NOTE — Telephone Encounter (Signed)
OK to proceed with removal and reinsertion of new IUD when patient is on her menstrual cycle.   Patient will have charge for removal, reinsertion, AND office visit on the same day.  We will need to discuss her ultrasound formally and determine the type of IUD to insert.   OK to Cytotec 200 mcg the night before and then the morning of the procedure.   Use back up protection currently as the IUD is low in the uterus and cervix currently.   Patient will also need a follow up ultrasound when she has her IUD check done so that we can recheck her ovarian cyst noted on the hospital ultrasound.   Cc - Billie RuddySally Yeakley Cathrine MusterSabrina Franklin

## 2014-07-26 NOTE — Telephone Encounter (Signed)
PR: $95.77 with PUS

## 2014-07-26 NOTE — Telephone Encounter (Signed)
Left message to call Alize Acy at [phone]. 

## 2014-07-29 NOTE — Telephone Encounter (Signed)
Spoke with patient. Advised patient of OOP cost as seen below from [place]. Patient is agreeable. Advised of message as seen below from Dr.Silva. Patient currently has paragard IUD in place. "I am not able to schedule with my cycle. I just started a new job and I can not take off. I work Monday-Friday. I can only come in on 1/18 or I will have to wait until February or later. My insurance is also going to change at the end of the month.I was waiting for a return call about this appointment from someone." Advised will need to speak with provider and return call with further recommendations and instructions.

## 2014-07-29 NOTE — Telephone Encounter (Signed)
Spoke with patient. Advised patient she absolutely needs to be on cycle for reinsertion of IUD. Advised to call with first day of cycle for that we can schedule appointment. "Why does it have to be on my cycle? When I got it in the first time I was no where near my cycle." Advised patient insertion with cycle is best as it allows for easier insertion, provides us that patient is not pregnant and having IUD inserted, helps to reduce irregular bleeding with new IUD insertion. "Well it is going to have to be March before I can come in then." Advised patient will need to use BUM and call to schedule with first day of next menses. Advised may have removal done without being on cycle. Patient declines appointment. "I need something orall for birth control in the mean time then." Advised patient would send a message over to Dr.Silva and return call with further recommendations. Patient is agreeable.

## 2014-07-29 NOTE — Telephone Encounter (Signed)
Left message for patient to call back  

## 2014-07-29 NOTE — Telephone Encounter (Signed)
Left message to call [name] at [phone].  Advised patient will need to be on cycle for reinsertion. Needs to use BUM and call with first day of next menses to schedule appointment.

## 2014-07-30 NOTE — Telephone Encounter (Signed)
Oral contraceptive will need to be Progesterone only pill, Micronor. Generic substitute is fine.  Please send to patient's pharmacy of choice. Please instruct in use and give 3 months worth. She needs to take every pill in the pack and be on time, not more than 6 hours late! Will need to use condoms for first month of use.

## 2014-07-30 NOTE — Telephone Encounter (Signed)
Left message to call Larsen Zettel at [phone]. 

## 2014-08-11 NOTE — Telephone Encounter (Signed)
Left message to call Avice Funchess at [phone]. 

## 2014-08-13 NOTE — Telephone Encounter (Signed)
Left message to call Khanh Tanori at [phone]. 

## 2014-08-17 NOTE — Telephone Encounter (Signed)
I have closed encounter.

## 2014-08-17 NOTE — Telephone Encounter (Signed)
Dr.Silva, I have attempted to reach this patient x3 with no return call. Okay to close encounter?

## 2014-09-23 ENCOUNTER — Telehealth: Payer: Self-pay | Admitting: Obstetrics and Gynecology

## 2014-09-23 DIAGNOSIS — Z30430 Encounter for insertion of intrauterine contraceptive device: Secondary | ICD-10-CM

## 2014-09-23 DIAGNOSIS — Z30432 Encounter for removal of intrauterine contraceptive device: Secondary | ICD-10-CM

## 2014-09-23 MED ORDER — MISOPROSTOL 200 MCG PO TABS: ORAL_TABLET | ORAL | Status: DC

## 2014-09-23 NOTE — Telephone Encounter (Signed)
Pt states she has discussed getting her iud and would like to make her appointment. Pt states she started her cycle this morning.

## 2014-09-23 NOTE — Telephone Encounter (Signed)
Spoke with patient. Advised of oop expectation when she comes in for IUD removal/reinsertion. Patient agreeable.

## 2014-09-23 NOTE — Telephone Encounter (Signed)
Spoke with patient. Patient states that she started her cycle today and would like to schedule IUD removal and reinsertion at this time. Advised patient will need to be scheduled by 3/16 as this will be day seven of her cycle. 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 I really can not take off and do not want to be uncomfortable at work." Advised this is office protocol for IUD insertion. "So do I have to wait another month to have my IUD inserted?" Advised patient will need to speak with provider regarding scheduling and return call. Patient is agreeable. Patient had new insurance and would like benefits checked as well. Staff message sent to [place] for precert with new insurance.

## 2014-09-23 NOTE — Telephone Encounter (Signed)
-----   Message from Jannet AskewKaitlyn E Hines, RN sent at 09/23/2014  1:11 PM EST ----- Elvina SidleHey! This patient would like to schedule her IUD removal and reinsertion. She has new insurance and would like to check her benefits. Now have [employer] Member number is [id number] and the group number is [id number]. I hope that helps.

## 2014-09-23 NOTE — Telephone Encounter (Addendum)
Spoke with patient. IUD removal/reinsert scheduled for tomorrow at 12:30pm with Dr.Silva (date and time per Kennon RoundsSally). Patient is agreeable to date and time. Pre procedure instructions given.  Motrin instructions given. Motrin=Advil=Ibuprofen, 800 mg one hour before appointment. Eat a meal and hydrate well before appointment. Cytotec instructions given. Take one tablet the night before procedure and one tablet the morning of procedure. Cytotec prescription sent to [facility] [address].   Aware will be called with precert information.  Cc: Cathrine MusterSabrina Franklin  Routing to provider for final review. Patient agreeable to disposition. Will close encounter

## 2014-09-24 ENCOUNTER — Encounter: Payer: Self-pay | Admitting: Obstetrics and Gynecology

## 2014-09-24 ENCOUNTER — Ambulatory Visit (INDEPENDENT_AMBULATORY_CARE_PROVIDER_SITE_OTHER): Payer: 59 | Admitting: Obstetrics and Gynecology

## 2014-09-24 VITALS — BP 120/68 | HR 66 | Ht 65.75 in | Wt 236.0 lb

## 2014-09-24 DIAGNOSIS — N8320 Unspecified ovarian cysts: Secondary | ICD-10-CM | POA: Diagnosis not present

## 2014-09-24 DIAGNOSIS — Z30432 Encounter for removal of intrauterine contraceptive device: Secondary | ICD-10-CM | POA: Diagnosis not present

## 2014-09-24 DIAGNOSIS — Z308 Encounter for other contraceptive management: Secondary | ICD-10-CM

## 2014-09-24 DIAGNOSIS — Z113 Encounter for screening for infections with a predominantly sexual mode of transmission: Secondary | ICD-10-CM | POA: Diagnosis not present

## 2014-09-24 DIAGNOSIS — Z30433 Encounter for removal and reinsertion of intrauterine contraceptive device: Secondary | ICD-10-CM

## 2014-09-24 DIAGNOSIS — Z30430 Encounter for insertion of intrauterine contraceptive device: Secondary | ICD-10-CM | POA: Diagnosis not present

## 2014-09-24 DIAGNOSIS — N83202 Unspecified ovarian cyst, left side: Secondary | ICD-10-CM

## 2014-09-24 NOTE — Progress Notes (Signed)
GYNECOLOGY  VISIT   HPI: 24 y.o.   Single  Caucasian  female   [id number] with Patient's last menstrual period was 09/23/2014 (exact date).   here for  IUD removal and reinsertion.  Took Cytotec and Motrin 800 mg.  ParaGard IUD low in cervical canal on ultrasound done on 07/22/14.   In December 2015, patient had left flank pain with nausea and vomiting and CT showed IUD to be low in endometrial canal and with one prong potentially in the myometrium.  No stones noted.  Follow up ultrasound on 07/22/14 through hospital showed the IUD to be low in the canal.  No notation of myometrial penetration.  Also noted to have complex left ovarian cyst 3.9 cm with septation and consistent with possible hemorrhagic cyst.  Likes ParaGard but does not like to have her cycle. Menses are heavy for 2 days with her cycle.   Has one partner.  No condom use.   Patient's last visit here was April 2015 for routine annual exam.  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  Had migraines with use of birth control pills.   GYNECOLOGIC HISTORY: Patient's last menstrual period was 09/23/2014 (exact date).          OB History    Gravida Para Term Preterm AB TAB SAB Ectopic Multiple Living   0 0 0 0 0 0 0 0 0 0          Patient Active Problem List   Diagnosis Date Noted  . Encounter for long-term (current) use of other medications 12/03/2013  . NASH (nonalcoholic steatohepatitis) 11/03/2013  . Diabetes 11/03/2013  . Migraine headache without aura 10/20/2013    Class: History of  . Obesity, unspecified 10/20/2013    Class: History of    Past Medical History  Diagnosis Date  . Migraines   . Anxiety   . Depression   . Diabetes 11/03/2013    Past Surgical History  Procedure Laterality Date  . Wisdom tooth extraction    . Intrauterine device (iud) insertion      paraguard iud inserted 02/2013    Current Outpatient Prescriptions  Medication Sig Dispense Refill  . acetaminophen (TYLENOL) 500 MG tablet  Take 2 tablets (1,000 mg total) by mouth every 8 (eight) hours as needed. Take 2 tabs every 8 hours. 30 tablet 0  . Canagliflozin (INVOKANA) 300 MG TABS Take 1 tablet (300 mg total) by mouth daily. 90 tablet 3  . Guaifenesin (MUCINEX MAXIMUM STRENGTH) 1200 MG TB12 Take 1 tablet (1,200 mg total) by mouth every 12 (twelve) hours as needed. 14 tablet 1  . ibuprofen (ADVIL,MOTRIN) 800 MG tablet Take 1 tab every eight hours for pain 30 tablet 0  . loratadine (CLARITIN) 10 MG tablet Take 10 mg by mouth daily as needed for allergies.     . metFORMIN (GLUCOPHAGE-XR) 500 MG 24 hr tablet Take 500 mg by mouth daily with breakfast. 1 tab daily    . misoprostol (CYTOTEC) 200 MCG tablet Take Cytotec 200 mcg tablet. 1 tablet night before the procedure. 1 tablet the morning of the procedure. 2 tablet 0  . Multiple Vitamin (MULTIVITAMIN WITH MINERALS) TABS tablet Take 1 tablet by mouth daily.    Marland Kitchen. PARAGARD INTRAUTERINE COPPER IU 1 each by Intrauterine route continuous.     . pioglitazone (ACTOS) 45 MG tablet Take 1 tablet (45 mg total) by mouth daily. 90 tablet 3   No current facility-administered medications for this visit.     ALLERGIES:  Review of patient's allergies indicates no known allergies.  Family History  Problem Relation Age of Onset  . Diabetes Mother   . Hypertension Mother   . Bipolar disorder Mother   . Diabetes Brother   . Breast cancer Maternal Aunt   . Diabetes Maternal Grandfather   . Diabetes Paternal Grandfather   . Heart attack Paternal Grandfather   . Breast cancer Maternal Aunt     History   Social History  . Marital Status: Single    Spouse Name: N/A  . Number of Children: N/A  . Years of Education: N/A   Occupational History  . Not on file.   Social History Main Topics  . Smoking status: Never Smoker   . Smokeless tobacco: Never Used  . Alcohol Use: No  . Drug Use: No  . Sexual Activity:    Partners: Male    Birth Control/ Protection: IUD     Comment: paragard    Other Topics Concern  . Not on file   Social History Narrative    ROS:  Pertinent items are noted in HPI.  PHYSICAL EXAMINATION:    BP 120/68 mmHg  Pulse 66  Ht 5' 5.75" (1.67 m)  Wt 236 lb (107.049 kg)  BMI 38.38 kg/m2  LMP 09/23/2014 (Exact Date)     General appearance: alert, cooperative and appears stated age  Pelvic: External genitalia:  no lesions              Urethra:  normal appearing urethra with no masses, tenderness or lesions              Bartholins and Skenes: normal                 Vagina: normal appearing vagina with normal color and discharge, no lesions              Cervix: normal appearance                   Bimanual Exam:  Uterus:  uterus is normal size, shape, consistency and nontender                                      Adnexa: normal adnexa in size, nontender and no masses  Procedure  -  ParaGard IUD removal and new ParaGard IUD placement.  New ParaGard - lot number [id number], expiration 1/22. Consent for procedures.  Speculum placed in vagina.  Prep with Hibiclens.  Tenaculum to anterior cervical lip.  Paracervical block with Lidocaine 1%, lot number [id number], expiration 12/15/14.       IUD removed intact and without difficulty using ring forceps.  Discarded.  Uterus sounded to almost 7.5 cm.  ParaGard IUD placed without difficulty.  Strings trimmed.  Instruments removed.  No complications.  Minimal EBL.                            ASSESSMENT  Expelling ParaGard IUD.  Migraines of OCP use. Complex left ovarian cyst, possible hemorrhagia cyst.  History of ASCUS, positive HR HPV.  Need for STD testing.   PLAN  GC/CT testing.  Discussion of IUDs and contraceptive options.  Discussion of abnormal paps and HPV. Instructions and precautions given regarding IUD.  Use back up protection for one month.  Return for pelvic ultrasound to recheck left ovary and position of IUD in 5  weeks.  Will need to push annual exam with Sara Chu forward to  May 2016.   An After Visit Summary was printed and given to the patient.  __25____ minutes face to face time of which over 50% was spent in counseling regarding IUDs and contraceptives - risks and benefits, left ovarian cyst, abnormal pap, and STD testing.  This was not just a procedure visit.  Patient will have an office visit charge also.

## 2014-09-24 NOTE — Patient Instructions (Signed)

## 2014-09-27 ENCOUNTER — Telehealth: Payer: Self-pay | Admitting: Obstetrics and Gynecology

## 2014-09-27 NOTE — Telephone Encounter (Signed)
Call to patient. Advised of benefit quote received for PUS. °Patient agreeable. Scheduled PUS. °Advised patient of 72 hour cancellation policy and $100 cancellation fee. Patient agreeable. °

## 2014-09-28 LAB — IPS N GONORRHOEA AND CHLAMYDIA BY PCR

## 2014-10-07 ENCOUNTER — Other Ambulatory Visit: Payer: 59

## 2014-10-07 ENCOUNTER — Encounter: Payer: Self-pay | Admitting: Obstetrics and Gynecology

## 2014-10-07 ENCOUNTER — Other Ambulatory Visit: Payer: 59 | Admitting: Obstetrics and Gynecology

## 2014-10-22 ENCOUNTER — Ambulatory Visit: Payer: Self-pay | Admitting: Certified Nurse Midwife

## 2014-12-24 ENCOUNTER — Ambulatory Visit: Payer: Self-pay | Admitting: Certified Nurse Midwife

## 2014-12-24 ENCOUNTER — Encounter: Payer: Self-pay | Admitting: Certified Nurse Midwife

## 2015-02-04 ENCOUNTER — Telehealth: Payer: Self-pay | Admitting: *Deleted

## 2015-02-04 NOTE — Telephone Encounter (Signed)
08 Pap recall due April 2016 due to ASCUS with + HR HPV on previous pap at age 24.  Past History:   10/20/13 Pap, ASCUS with + HR HPV  Pt has not scheduled AEX with Lovett Sox, CNM.  Please call pt to schedule AEX.  Thank you.

## 2015-02-08 NOTE — Telephone Encounter (Signed)
Left Voicemail to call back to schedule AEX.  

## 2015-02-10 NOTE — Telephone Encounter (Signed)
Second attempt to contact patient. Left Voicemail to call back to schedule AEX.  

## 2015-02-14 ENCOUNTER — Encounter: Payer: Self-pay | Admitting: *Deleted

## 2015-02-14 NOTE — Telephone Encounter (Signed)
Pt has not returned call or scheduled AEX.  Letter created.  Please advise recall. 

## 2015-02-15 ENCOUNTER — Encounter: Payer: Self-pay | Admitting: *Deleted

## 2015-02-15 NOTE — Telephone Encounter (Signed)
Letter printed and signed.  Ok to send.  Out of recall.

## 2015-02-15 NOTE — Telephone Encounter (Signed)
Letter mailed and marked as sent.  Pt removed from current recall.  Closing encounter. 

## 2015-03-03 ENCOUNTER — Encounter: Payer: Self-pay | Admitting: Obstetrics and Gynecology

## 2015-04-13 ENCOUNTER — Emergency Department (HOSPITAL_COMMUNITY)
Admission: EM | Admit: 2015-04-13 | Discharge: 2015-04-13 | Disposition: A | Discharge status: Home / Self Care | Payer: 59 | Attending: Emergency Medicine | Admitting: Emergency Medicine

## 2015-04-13 ENCOUNTER — Encounter (HOSPITAL_COMMUNITY): Payer: Self-pay | Admitting: Emergency Medicine

## 2015-04-13 DIAGNOSIS — F32A Depression, unspecified: Secondary | ICD-10-CM

## 2015-04-13 DIAGNOSIS — Z880 Allergy status to penicillin: Secondary | ICD-10-CM | POA: Insufficient documentation

## 2015-04-13 DIAGNOSIS — Z8679 Personal history of other diseases of the circulatory system: Secondary | ICD-10-CM | POA: Insufficient documentation

## 2015-04-13 DIAGNOSIS — Z79899 Other long term (current) drug therapy: Secondary | ICD-10-CM | POA: Insufficient documentation

## 2015-04-13 DIAGNOSIS — F329 Major depressive disorder, single episode, unspecified: Secondary | ICD-10-CM | POA: Insufficient documentation

## 2015-04-13 DIAGNOSIS — E119 Type 2 diabetes mellitus without complications: Secondary | ICD-10-CM | POA: Insufficient documentation

## 2015-04-13 DIAGNOSIS — F419 Anxiety disorder, unspecified: Secondary | ICD-10-CM | POA: Insufficient documentation

## 2015-04-13 MED ORDER — LORAZEPAM 0.5 MG PO TABS: 0.5000 mg | ORAL_TABLET | Freq: Three times a day (TID) | ORAL | Status: DC | PRN

## 2015-04-13 NOTE — ED Provider Notes (Signed)
CSN: [id number]     Arrival date & time 04/13/15  0411 History   First MD Initiated Contact with Patient 04/13/15 0430     Chief Complaint  Patient presents with  . Depression     (Consider location/radiation/quality/duration/timing/severity/associated sxs/prior Treatment) Patient is a 24 y.o. female presenting with depression. The history is provided by the patient. No language interpreter was used.  Depression This is a recurrent problem. Pertinent negatives include no chills or fever. Associated symptoms comments: Patient with a history of depression last treated with medication 4 years ago, presents with recurrent depressive symptoms of hopeless, sadness, sleep interruption. No SI/HI. No substance abuse issues. She states that symptoms have been going on for "a while" but she has been maintaining on her own. Tonight she felt symptoms were at their worst prompting visit to the emergency room for possible medication and referrals for outpatient therapy..    Past Medical History  Diagnosis Date  . Migraines   . Anxiety   . Depression   . Diabetes 11/03/2013   Past Surgical History  Procedure Laterality Date  . Wisdom tooth extraction    . Intrauterine device (iud) insertion      paraguard iud inserted 02/2013   Family History  Problem Relation Age of Onset  . Diabetes Mother   . Hypertension Mother   . Bipolar disorder Mother   . Diabetes Brother   . Breast cancer Maternal Aunt   . Diabetes Maternal Grandfather   . Diabetes Paternal Grandfather   . Heart attack Paternal Grandfather   . Breast cancer Maternal Aunt    Social History  Substance Use Topics  . Smoking status: Never Smoker   . Smokeless tobacco: Never Used  . Alcohol Use: No     Comment: occ   OB History    Gravida Para Term Preterm AB TAB SAB Ectopic Multiple Living       Review of Systems  Constitutional: Negative for fever and chills.  HENT: Negative.   Respiratory: Negative.    Cardiovascular: Negative.   Gastrointestinal: Negative.   Musculoskeletal: Negative.   Skin: Negative.   Neurological: Negative.   Psychiatric/Behavioral: Positive for depression, sleep disturbance and dysphoric mood. Negative for suicidal ideas and self-injury.      Allergies  Fioricet and Amoxicillin  Home Medications   Prior to Admission medications   Medication Sig Start Date End Date Taking? Authorizing Provider  acetaminophen (TYLENOL) 500 MG tablet Take 2 tablets (1,000 mg total) by mouth every 8 (eight) hours as needed. Take 2 tabs every 8 hours. 06/25/14   Ofilia Neas, PA-C  Canagliflozin (INVOKANA) 300 MG TABS Take 1 tablet (300 mg total) by mouth daily. 11/03/13   Romero Belling, MD  Guaifenesin [facility] MAXIMUM STRENGTH) 1200 MG TB12 Take 1 tablet (1,200 mg total) by mouth every 12 (twelve) hours as needed. 06/25/14   Ofilia Neas, PA-C  ibuprofen (ADVIL,MOTRIN) 800 MG tablet Take 1 tab every eight hours for pain 06/25/14   Ofilia Neas, PA-C  loratadine (CLARITIN) 10 MG tablet Take 10 mg by mouth daily as needed for allergies.     Historical Provider, MD  metFORMIN (GLUCOPHAGE-XR) 500 MG 24 hr tablet Take 500 mg by mouth daily with breakfast. 1 tab daily 11/03/13   Romero Belling, MD  misoprostol (CYTOTEC) 200 MCG tablet Take Cytotec 200 mcg tablet. 1 tablet night before the procedure. 1 tablet the morning of the procedure. 09/23/14  Brook E Ardell Isaacs, MD  Multiple Vitamin (MULTIVITAMIN WITH MINERALS) TABS tablet Take 1 tablet by mouth daily.    Historical Provider, MD  PARAGARD INTRAUTERINE COPPER IU 1 each by Intrauterine route continuous.     Historical Provider, MD  pioglitazone (ACTOS) 45 MG tablet Take 1 tablet (45 mg total) by mouth daily. 11/03/13   Romero Belling, MD   BP 147/90 mmHg  Pulse 92  Temp(Src) 97.6 F (36.4 C) (Oral)  Resp 18  Ht  (1.676 m)  Wt 236 lb (107.049 kg)  BMI 38.11 kg/m2  SpO2 100%  LMP 03/20/2015 (Exact Date) Physical  Exam  Constitutional: She is oriented to person, place, and time. She appears well-developed and well-nourished.  HENT:  Head: Normocephalic.  Neck: Normal range of motion. Neck supple.  Cardiovascular: Normal rate and regular rhythm.   Pulmonary/Chest: Effort normal and breath sounds normal.  Abdominal: Soft. Bowel sounds are normal. There is no tenderness. There is no rebound and no guarding.  Musculoskeletal: Normal range of motion.  Neurological: She is alert and oriented to person, place, and time.  Skin: Skin is warm and dry. No rash noted.  Psychiatric: Her speech is normal and behavior is normal. Judgment and thought content normal. Cognition and memory are normal. She exhibits a depressed mood.  Tearful.    ED Course  Procedures (including critical care time) Labs Review Labs Reviewed - No data to display  Imaging Review No results found. I have personally reviewed and evaluated these images and lab results as part of my medical decision-making.   EKG Interpretation None      MDM   Final diagnoses:  None    1. Depression  The patient is not suicidal or homicidal. She is not intoxicated and does not endorse substance abuse. She can be discharged safely for outpatient follow up and treatment. Patient is comfortable with plan of care. Will provide #8 Ativan for prn use.     Elpidio Anis, PA-C 04/13/15 0459  Loren Racer, MD 04/13/15 [phone]

## 2015-04-13 NOTE — Discharge Instructions (Signed)

## 2015-04-13 NOTE — ED Notes (Signed)
Patient c/o ongoing depression. Patient requesting medication to help with depression and referral for outpatient counselor. Denies SI/HI. No hallucinations.

## 2015-05-09 ENCOUNTER — Encounter: Payer: Self-pay | Admitting: Obstetrics and Gynecology

## 2015-07-10 IMAGING — US US TRANSVAGINAL NON-OB
1 series · 14 of 25 positions shown · non-contrast
Comparison: None

CLINICAL DATA: Malpositioned IUD



[Series 1: us pelvis complete · 14 of 80 slices shown]
[im 1/80]
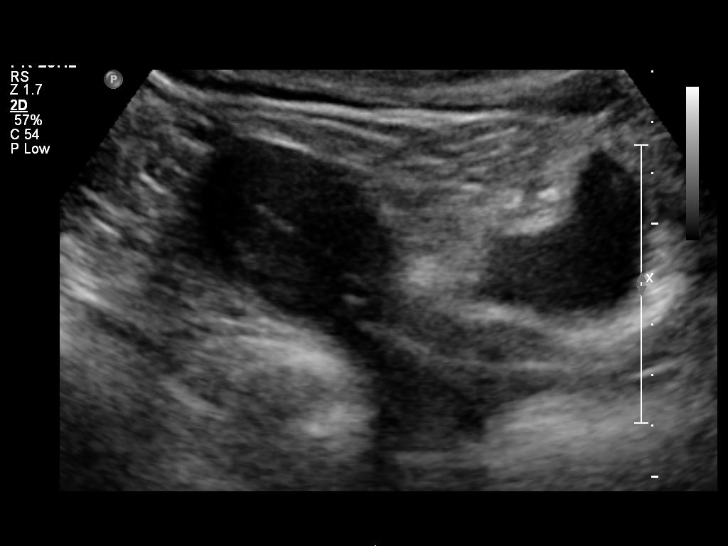
[im 7/80]
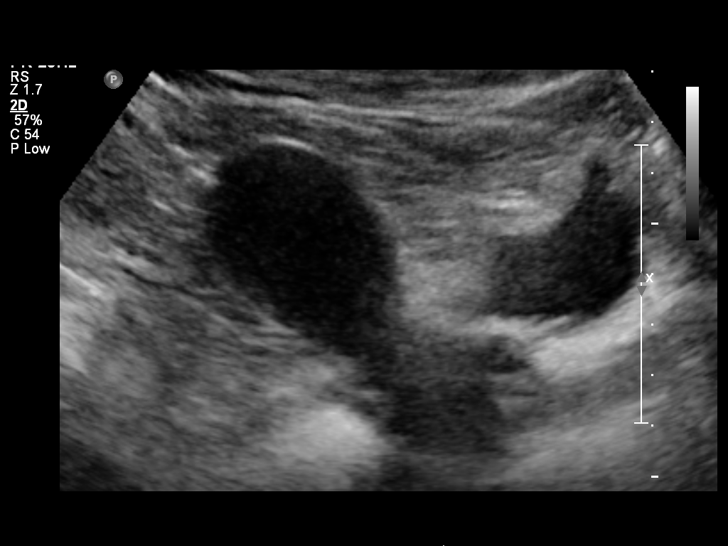
[im 14/80]
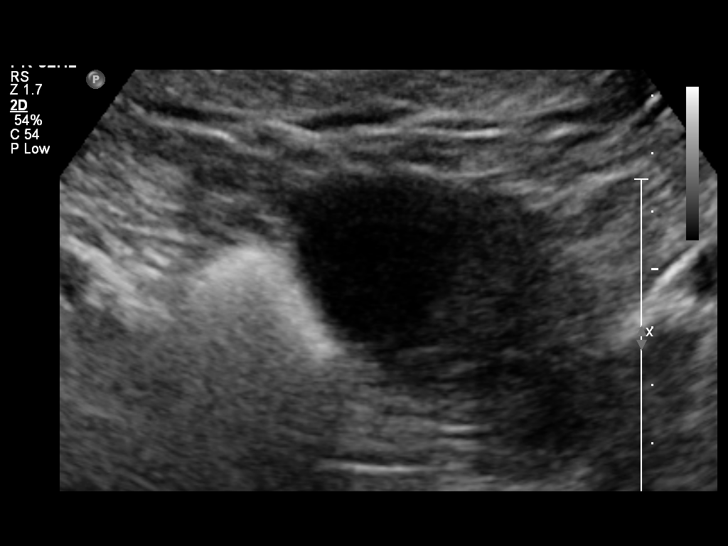
[im 20/80]
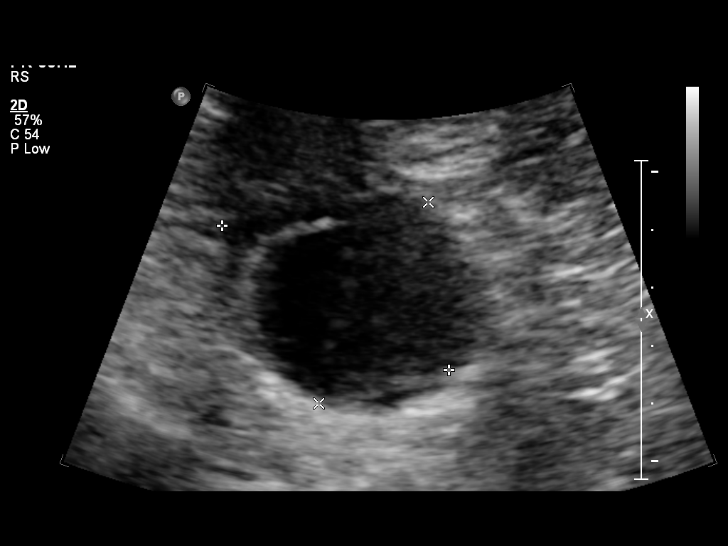
[im 27/80]
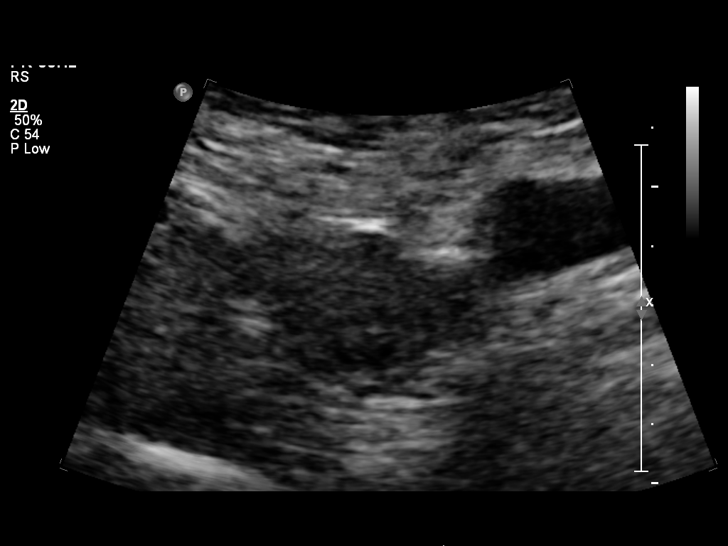
[im 30/80]
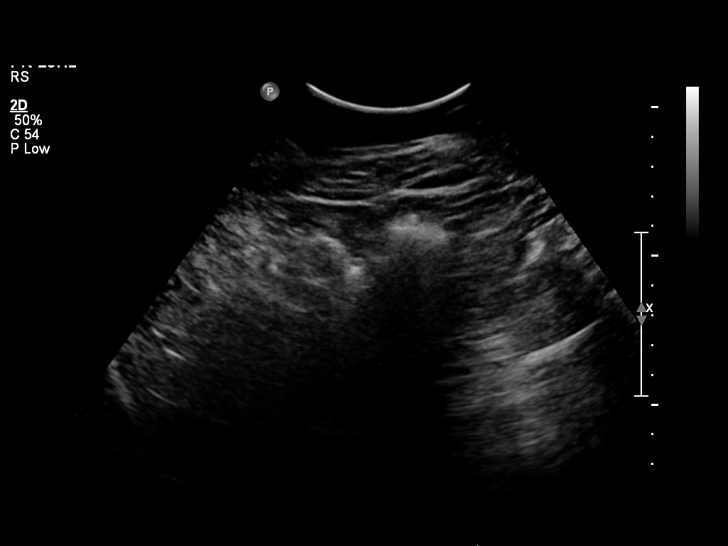
[im 37/80]
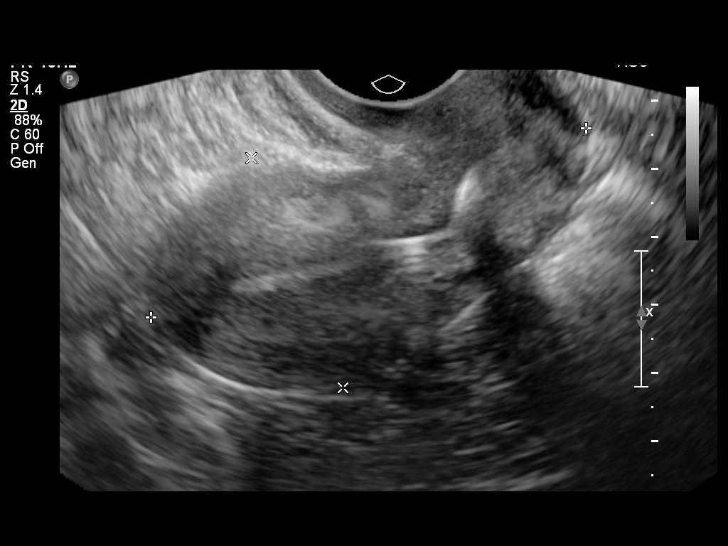
[im 43/80]
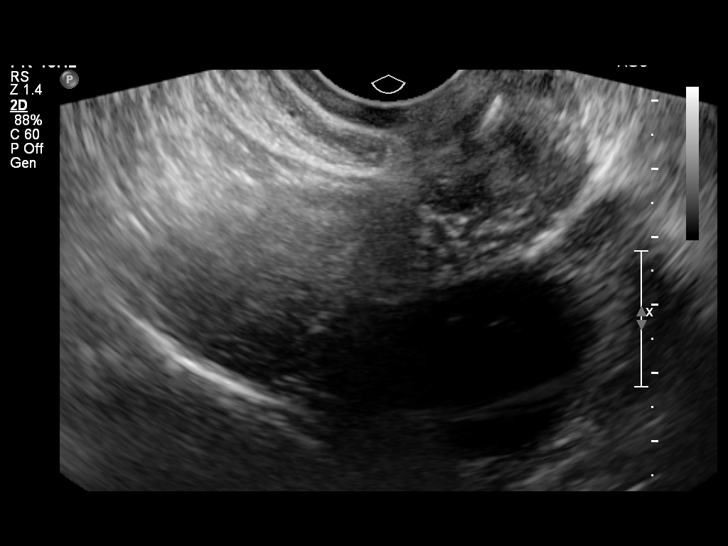
[im 50/80]
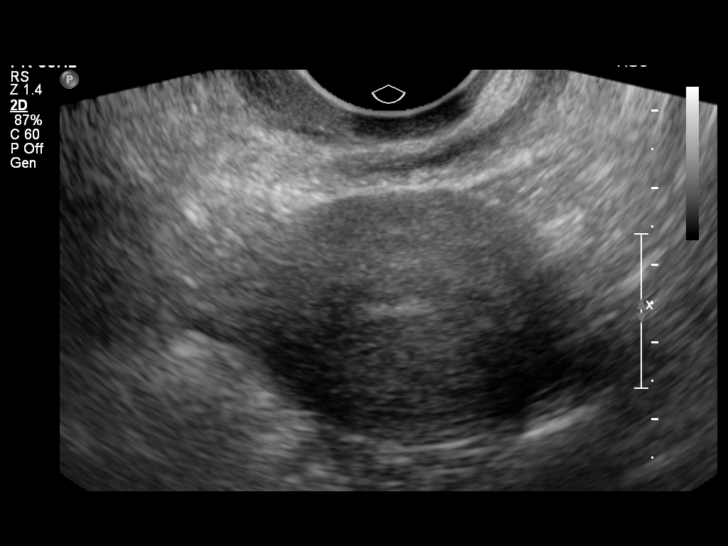
[im 53/80]
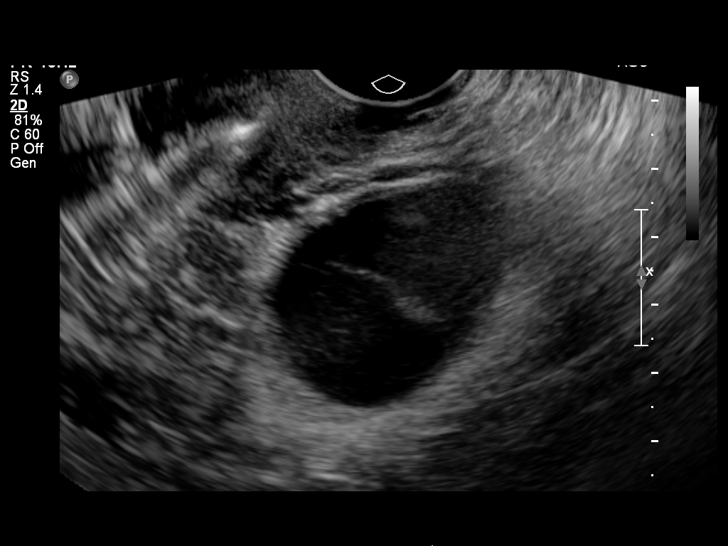
[im 60/80]
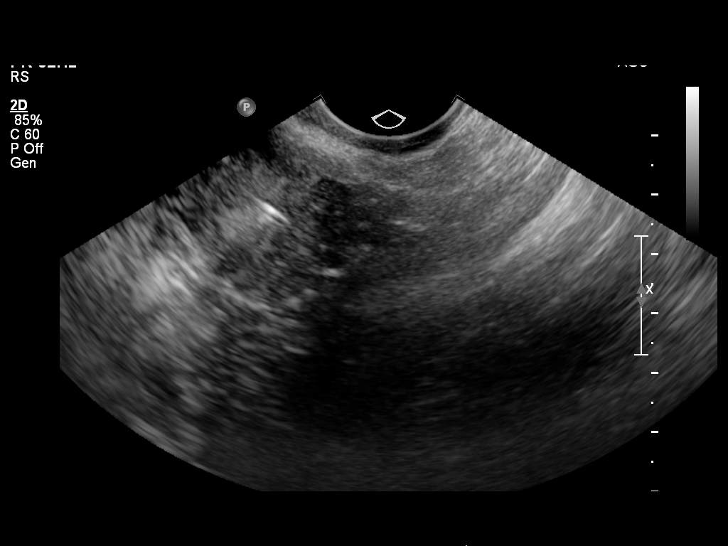
[im 66/80]
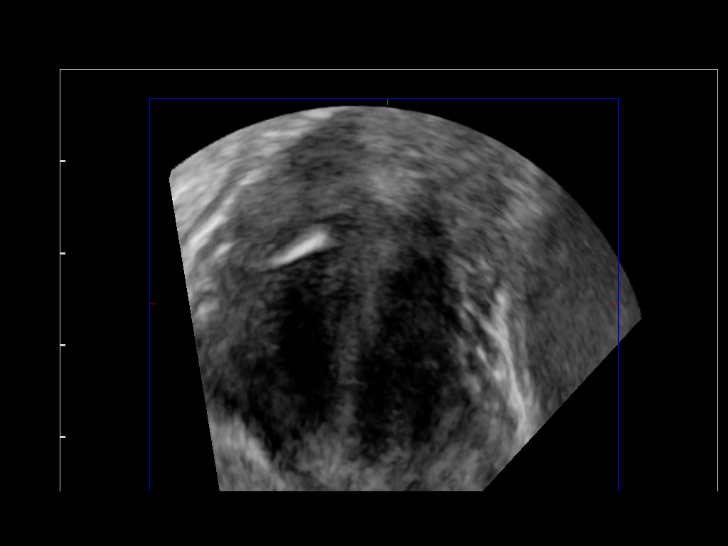
[im 73/80]
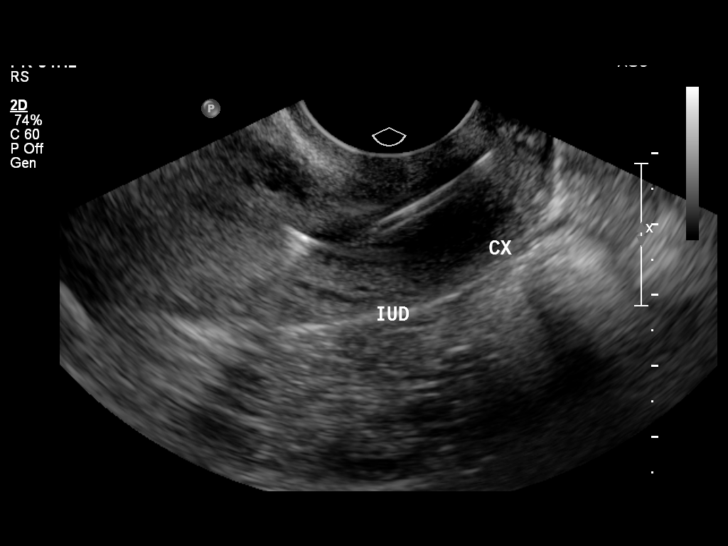
[im 80/80]
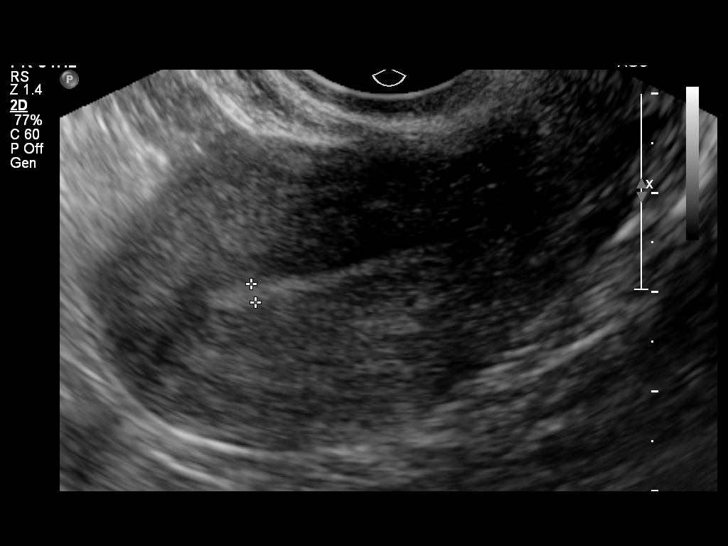

[14 of 25 positions shown; findings below may reference images not displayed]

FINDINGS: Uterus

Measurements: 7.0 x 3.6 x 3.7 cm. No fibroids or other mass
visualized.

Endometrium

Thickness: 2 mm.  IUD in the lower uterine segment/cervix.

Right ovary

Measurements: 2.3 x 1.2 x 1.6 cm. Normal appearance/no adnexal mass.

Left ovary

Measurements: 4.2 x 3.2 x 4.1 cm. 3.6 x 3.9 x 2.8 cm complex cyst
with septations, favored to reflect retracted clot within a
hemorrhagic cyst, although technically indeterminate.

Other findings

No free fluid.
IMPRESSION: Malpositioned IUD in the lower uterine segment/cervix.

3.9 cm complex left ovarian cyst, as described above, indeterminate.
Follow-up pelvic ultrasound is suggested in 6-12 weeks.

## 2015-09-19 ENCOUNTER — Encounter (HOSPITAL_COMMUNITY): Payer: Self-pay | Admitting: *Deleted

## 2015-10-13 ENCOUNTER — Emergency Department (HOSPITAL_COMMUNITY)
Admission: EM | Admit: 2015-10-13 | Discharge: 2015-10-13 | Disposition: A | Discharge status: Home / Self Care | Payer: 59 | Attending: Emergency Medicine | Admitting: Emergency Medicine

## 2015-10-13 ENCOUNTER — Emergency Department (HOSPITAL_COMMUNITY): Payer: 59

## 2015-10-13 ENCOUNTER — Encounter (HOSPITAL_COMMUNITY): Payer: Self-pay | Admitting: Emergency Medicine

## 2015-10-13 DIAGNOSIS — R739 Hyperglycemia, unspecified: Secondary | ICD-10-CM

## 2015-10-13 DIAGNOSIS — Z7984 Long term (current) use of oral hypoglycemic drugs: Secondary | ICD-10-CM | POA: Diagnosis not present

## 2015-10-13 DIAGNOSIS — Z79899 Other long term (current) drug therapy: Secondary | ICD-10-CM | POA: Insufficient documentation

## 2015-10-13 DIAGNOSIS — Z8679 Personal history of other diseases of the circulatory system: Secondary | ICD-10-CM | POA: Diagnosis not present

## 2015-10-13 DIAGNOSIS — Z880 Allergy status to penicillin: Secondary | ICD-10-CM | POA: Diagnosis not present

## 2015-10-13 DIAGNOSIS — R110 Nausea: Secondary | ICD-10-CM | POA: Insufficient documentation

## 2015-10-13 DIAGNOSIS — F329 Major depressive disorder, single episode, unspecified: Secondary | ICD-10-CM | POA: Insufficient documentation

## 2015-10-13 DIAGNOSIS — R1031 Right lower quadrant pain: Secondary | ICD-10-CM | POA: Diagnosis not present

## 2015-10-13 DIAGNOSIS — F419 Anxiety disorder, unspecified: Secondary | ICD-10-CM | POA: Insufficient documentation

## 2015-10-13 DIAGNOSIS — E1165 Type 2 diabetes mellitus with hyperglycemia: Secondary | ICD-10-CM | POA: Insufficient documentation

## 2015-10-13 DIAGNOSIS — Z3202 Encounter for pregnancy test, result negative: Secondary | ICD-10-CM | POA: Diagnosis not present

## 2015-10-13 LAB — COMPREHENSIVE METABOLIC PANEL
ALK PHOS: 64 U/L (ref 38–126)
ALT: 14 U/L (ref 14–54)
AST: 17 U/L (ref 15–41)
Albumin: 3.9 g/dL (ref 3.5–5.0)
Anion gap: 9 (ref 5–15)
BUN: 14 mg/dL (ref 6–20)
CALCIUM: 9.1 mg/dL (ref 8.9–10.3)
CO2: 23 mmol/L (ref 22–32)
CREATININE: 0.8 mg/dL (ref 0.44–1.00)
Chloride: 105 mmol/L (ref 101–111)
Glucose, Bld: 413 mg/dL — ABNORMAL HIGH (ref 65–99)
Potassium: 4.3 mmol/L (ref 3.5–5.1)
SODIUM: 137 mmol/L (ref 135–145)
Total Bilirubin: 0.5 mg/dL (ref 0.3–1.2)
Total Protein: 8.1 g/dL (ref 6.5–8.1)

## 2015-10-13 LAB — URINALYSIS, ROUTINE W REFLEX MICROSCOPIC
BILIRUBIN URINE: NEGATIVE
Glucose, UA: >1000 mg/dL — AB
HGB URINE DIPSTICK: NEGATIVE
KETONES UR: NEGATIVE mg/dL
Leukocytes, UA: NEGATIVE
Nitrite: NEGATIVE
PROTEIN: NEGATIVE mg/dL
Specific Gravity, Urine: 1.034 — ABNORMAL HIGH (ref 1.005–1.030)
pH: 7.5 (ref 5.0–8.0)

## 2015-10-13 LAB — URINE MICROSCOPIC-ADD ON: RBC / HPF: NONE SEEN RBC/hpf (ref 0–5)

## 2015-10-13 LAB — CBC
HCT: 42.7 % (ref 36.0–46.0)
Hemoglobin: 14.8 g/dL (ref 12.0–15.0)
MCH: 30 pg (ref 26.0–34.0)
MCHC: 34.7 g/dL (ref 30.0–36.0)
MCV: 86.6 fL (ref 78.0–100.0)
Platelets: 294 10*3/uL (ref 150–400)
RBC: 4.93 MIL/uL (ref 3.87–5.11)
RDW: 12.8 % (ref 11.5–15.5)
WBC: 6.4 10*3/uL (ref 4.0–10.5)

## 2015-10-13 LAB — CBG MONITORING, ED: Glucose-Capillary: 230 mg/dL — ABNORMAL HIGH (ref 65–99)

## 2015-10-13 LAB — LIPASE, BLOOD: Lipase: 34 U/L (ref 11–51)

## 2015-10-13 LAB — I-STAT BETA HCG BLOOD, ED (MC, WL, AP ONLY)

## 2015-10-13 MED ORDER — IOHEXOL 300 MG/ML  SOLN
25.0000 mL | Freq: Once | INTRAMUSCULAR | Status: AC | PRN
  Administered 2015-10-13: 25 mL via ORAL

## 2015-10-13 MED ORDER — IOPAMIDOL (ISOVUE-300) INJECTION 61%
100.0000 mL | Freq: Once | INTRAVENOUS | Status: AC | PRN
  Administered 2015-10-13: 100 mL via INTRAVENOUS

## 2015-10-13 MED ORDER — SODIUM CHLORIDE 0.9 % IV BOLUS (SEPSIS)
2000.0000 mL | Freq: Once | INTRAVENOUS | Status: AC
  Administered 2015-10-13: 2000 mL via INTRAVENOUS

## 2015-10-13 MED ORDER — ONDANSETRON HCL 4 MG PO TABS
4.0000 mg | ORAL_TABLET | Freq: Four times a day (QID) | ORAL | Status: DC
Start: 2015-10-13 — End: 2016-05-12

## 2015-10-13 MED ORDER — ONDANSETRON HCL 4 MG/2ML IJ SOLN
4.0000 mg | Freq: Once | INTRAMUSCULAR | Status: AC | PRN
  Administered 2015-10-13: 4 mg via INTRAVENOUS
  Filled 2015-10-13: qty 2

## 2015-10-13 NOTE — ED Notes (Signed)
PA at bedside.

## 2015-10-13 NOTE — ED Notes (Signed)
Pt states she started having RLQ pain approx. 4 hours ago. Nausea. Denies vomiting or diarrhea. Alert and oriented.

## 2015-10-13 NOTE — ED Provider Notes (Signed)
CSN: [id number]     Arrival date & time 10/13/15  0201 History   First MD Initiated Contact with Patient 10/13/15 [phone]     Chief Complaint  Patient presents with  . Abdominal Pain     (Consider location/radiation/quality/duration/timing/severity/associated sxs/prior Treatment) HPI Comments: RLQ abdominal pain that woke her up tonight about 4 hours prior to arrival. She felt fine prior to going to bed. No vomiting or diarrhea but she endorses nausea. No fever. She denies dysuria, vaginal discharge or irregular vaginal bleeding. She has a history of ovarian cyst but reports this pain is dissimilar to those symptoms. The pain does not radiate, it is worse when she moves to a sitting position or when reaching while in a sitting position. No previous history of same.  Patient is a 25 y.o. female presenting with abdominal pain. The history is provided by the patient. No language interpreter was used.  Abdominal Pain Pain location:  RLQ Pain quality: sharp   Associated symptoms: nausea   Associated symptoms: no chills, no diarrhea, no dysuria, no fever, no vaginal bleeding, no vaginal discharge and no vomiting     Past Medical History  Diagnosis Date  . Migraines   . Anxiety   . Depression   . Diabetes (HCC) 11/03/2013   Past Surgical History  Procedure Laterality Date  . Wisdom tooth extraction    . Intrauterine device (iud) insertion      paraguard iud inserted 02/2013   Family History  Problem Relation Age of Onset  . Diabetes Mother   . Hypertension Mother   . Bipolar disorder Mother   . Diabetes Brother   . Breast cancer Maternal Aunt   . Diabetes Maternal Grandfather   . Diabetes Paternal Grandfather   . Heart attack Paternal Grandfather   . Breast cancer Maternal Aunt    Social History  Substance Use Topics  . Smoking status: Never Smoker   . Smokeless tobacco: Never Used  . Alcohol Use: No     Comment: occ   OB History    Gravida Para Term Preterm AB TAB SAB  Ectopic Multiple Living       Review of Systems  Constitutional: Negative for fever and chills.  HENT: Negative.   Respiratory: Negative.   Cardiovascular: Negative.   Gastrointestinal: Positive for nausea and abdominal pain. Negative for vomiting and diarrhea.  Genitourinary: Negative for dysuria, flank pain, vaginal bleeding and vaginal discharge.  Musculoskeletal: Negative.  Negative for back pain.  Neurological: Negative.       Allergies  Fioricet and Amoxicillin  Home Medications   Prior to Admission medications   Medication Sig Start Date End Date Taking? Authorizing Provider  escitalopram (LEXAPRO) 20 MG tablet Take 20 mg by mouth daily.   Yes Historical Provider, MD  ibuprofen (ADVIL,MOTRIN) 200 MG tablet Take 600 mg by mouth every 6 (six) hours as needed for moderate pain.   Yes Historical Provider, MD  metFORMIN (GLUCOPHAGE-XR) 500 MG 24 hr tablet Take 500 mg by mouth daily with breakfast.  11/03/13  Yes Romero Belling, MD  pioglitazone (ACTOS) 45 MG tablet Take 1 tablet (45 mg total) by mouth daily. 11/03/13  Yes Romero Belling, MD  acetaminophen (TYLENOL) 500 MG tablet Take 2 tablets (1,000 mg total) by mouth every 8 (eight) hours as needed. Take 2 tabs every 8 hours. Patient not taking: Reported on 10/13/2015 06/25/14   Ofilia Neas, PA-C  Canagliflozin [facility]) 300  MG TABS Take 1 tablet (300 mg total) by mouth daily. Patient not taking: Reported on 10/13/2015 11/03/13   Romero BellingSean Ellison, MD  Guaifenesin [facility](MUCINEX MAXIMUM STRENGTH) 1200 MG TB12 Take 1 tablet (1,200 mg total) by mouth every 12 (twelve) hours as needed. Patient not taking: Reported on 04/13/2015 06/25/14   Ofilia NeasMichael L Clark, PA-C  ibuprofen (ADVIL,MOTRIN) 800 MG tablet Take 1 tab every eight hours for pain Patient not taking: Reported on 10/13/2015 06/25/14   Ofilia NeasMichael L Clark, PA-C  LORazepam (ATIVAN) 0.5 MG tablet Take 1 tablet (0.5 mg total) by mouth 3 (three) times daily as needed for  anxiety. Patient not taking: Reported on 10/13/2015 04/13/15   Elpidio AnisShari Merrissa Giacobbe, PA-C  misoprostol (CYTOTEC) 200 MCG tablet Take Cytotec 200 mcg tablet. 1 tablet night before the procedure. 1 tablet the morning of the procedure. Patient not taking: Reported on 04/13/2015 09/23/14   Patton SallesBrook E Amundson C Silva, MD  PARAGARD INTRAUTERINE COPPER IU 1 each by Intrauterine route continuous.     Historical Provider, MD   BP 153/96 mmHg  Pulse 92  Temp(Src) 97.9 F (36.6 C) (Oral)  Resp 18  SpO2 100%  LMP 10/05/2015 (Exact Date) Physical Exam  Constitutional: She is oriented to person, place, and time. She appears well-developed and well-nourished.  HENT:  Head: Normocephalic.  Neck: Normal range of motion. Neck supple.  Cardiovascular: Normal rate and regular rhythm.   Pulmonary/Chest: Effort normal and breath sounds normal. She has no wheezes. She has no rales.  Abdominal: Soft. Bowel sounds are normal. There is tenderness (RLQ tenderness without guarding or rebound). There is no rebound and no guarding.  Musculoskeletal: Normal range of motion.  Neurological: She is alert and oriented to person, place, and time.  Skin: Skin is warm and dry. No rash noted.  Psychiatric: She has a normal mood and affect.    ED Course  Procedures (including critical care time) Labs Review Labs Reviewed  CBC  LIPASE, BLOOD  COMPREHENSIVE METABOLIC PANEL  URINALYSIS, ROUTINE W REFLEX MICROSCOPIC (NOT [facility])  I-STAT BETA HCG BLOOD, ED ([facility], [facility], [facility] ONLY)    Imaging Review No results found. I have personally reviewed and evaluated these images and lab results as part of my medical decision-making.   EKG Interpretation None      MDM   Final diagnoses:  None    1. RLQ abdominal pain 2. Hyperglycemia  CT scan performed to evaluate for appendicitis and is found to be negative. No ovarian cysts observed. The patient declined pain medication while in the ED.  She has hyperglycemia to over 400 and  reports she has been out of her Metformin. She has a new Rx but could not get it filled until tomorrow. No vomiting. No history of acidosis. No evidence acidosis tonight. CBG 230 after fluids.  The patient is stable for discharge home.     Elpidio AnisShari Babette Stum, PA-C 10/13/15 [id number]  Azalia BilisKevin Campos, MD 10/13/15 [phone]

## 2015-10-13 NOTE — Discharge Instructions (Signed)
Abdominal Pain, Adult °Many things can cause abdominal pain. Usually, abdominal pain is not caused by a disease and will improve without treatment. It can often be observed and treated at home. Your health care provider will do a physical exam and possibly order blood tests and X-rays to help determine the seriousness of your pain. However, in many cases, more time must pass before a clear cause of the pain can be found. Before that point, your health care provider may not know if you need more testing or further treatment. °HOME CARE INSTRUCTIONS °Monitor your abdominal pain for any changes. The following actions may help to alleviate any discomfort you are experiencing: °· Only take over-the-counter or prescription medicines as directed by your health care provider. °· Do not take laxatives unless directed to do so by your health care provider. °· Try a clear liquid diet (broth, tea, or water) as directed by your health care provider. Slowly move to a bland diet as tolerated. °SEEK MEDICAL CARE IF: °· You have unexplained abdominal pain. °· You have abdominal pain associated with nausea or diarrhea. °· You have pain when you urinate or have a bowel movement. °· You experience abdominal pain that wakes you in the night. °· You have abdominal pain that is worsened or improved by eating food. °· You have abdominal pain that is worsened with eating fatty foods. °· You have a fever. °SEEK IMMEDIATE MEDICAL CARE IF: °· Your pain does not go away within 2 hours. °· You keep throwing up (vomiting). °· Your pain is felt only in portions of the abdomen, such as the right side or the left lower portion of the abdomen. °· You pass bloody or black tarry stools. °MAKE SURE YOU: °· Understand these instructions. °· Will watch your condition. °· Will get help right away if you are not doing well or get worse. °  °This information is not intended to replace advice given to you by your health care provider. Make sure you discuss  any questions you have with your health care provider. °  °Document Released: 04/11/2005 Document Revised: 03/23/2015 Document Reviewed: 03/11/2013 °Elsevier Interactive Patient Education ©2016 [employer] ° °Hyperglycemia °Hyperglycemia occurs when the glucose (sugar) in your blood is too high. Hyperglycemia can happen for many reasons, but it most often happens to people who do not know they have diabetes or are not managing their diabetes properly.  °CAUSES  °Whether you have diabetes or not, there are other causes of hyperglycemia. Hyperglycemia can occur when you have diabetes, but it can also occur in other situations that you might not be as aware of, such as: °Diabetes °· If you have diabetes and are having problems controlling your blood glucose, hyperglycemia could occur because of some of the following reasons: °¨ Not following your meal plan. °¨ Not taking your diabetes medications or not taking it properly. °¨ Exercising less or doing less activity than you normally do. °¨ Being sick. °Pre-diabetes °· This cannot be ignored. Before people develop Type 2 diabetes, they almost always have "pre-diabetes." This is when your blood glucose levels are higher than normal, but not yet high enough to be diagnosed as diabetes. Research has shown that some long-term damage to the body, especially the heart and circulatory system, may already be occurring during pre-diabetes. If you take action to manage your blood glucose when you have pre-diabetes, you may delay or prevent Type 2 diabetes from developing. °Stress °· If you have diabetes, you may be "diet"   controlled or on oral medications or insulin to control your diabetes. However, you may find that your blood glucose is higher than usual in the hospital whether you have diabetes or not. This is often referred to as "stress hyperglycemia." Stress can elevate your blood glucose. This happens because of hormones put out by the body during times of stress. If  stress has been the cause of your high blood glucose, it can be followed regularly by your caregiver. That way he/she can make sure your hyperglycemia does not continue to get worse or progress to diabetes. °Steroids °· Steroids are medications that act on the infection fighting system (immune system) to block inflammation or infection. One side effect can be a rise in blood glucose. Most people can produce enough extra insulin to allow for this rise, but for those who cannot, steroids make blood glucose levels go even higher. It is not unusual for steroid treatments to "uncover" diabetes that is developing. It is not always possible to determine if the hyperglycemia will go away after the steroids are stopped. A special blood test called an A1c is sometimes done to determine if your blood glucose was elevated before the steroids were started. °SYMPTOMS °· Thirsty. °· Frequent urination. °· Dry mouth. °· Blurred vision. °· Tired or fatigue. °· Weakness. °· Sleepy. °· Tingling in feet or leg. °DIAGNOSIS  °Diagnosis is made by monitoring blood glucose in one or all of the following ways: °· A1c test. This is a chemical found in your blood. °· Fingerstick blood glucose monitoring. °· Laboratory results. °TREATMENT  °First, knowing the cause of the hyperglycemia is important before the hyperglycemia can be treated. Treatment may include, but is not be limited to: °· Education. °· Change or adjustment in medications. °· Change or adjustment in meal plan. °· Treatment for an illness, infection, etc. °· More frequent blood glucose monitoring. °· Change in exercise plan. °· Decreasing or stopping steroids. °· Lifestyle changes. °HOME CARE INSTRUCTIONS  °· Test your blood glucose as directed. °· Exercise regularly. Your caregiver will give you instructions about exercise. Pre-diabetes or diabetes which comes on with stress is helped by exercising. °· Eat wholesome, balanced meals. Eat often and at regular, fixed times. Your  caregiver or nutritionist will give you a meal plan to guide your sugar intake. °· Being at an ideal weight is important. If needed, losing as little as 10 to 15 pounds may help improve blood glucose levels. °SEEK MEDICAL CARE IF:  °· You have questions about medicine, activity, or diet. °· You continue to have symptoms (problems such as increased thirst, urination, or weight gain). °SEEK IMMEDIATE MEDICAL CARE IF:  °· You are vomiting or have diarrhea. °· Your breath smells fruity. °· You are breathing faster or slower. °· You are very sleepy or incoherent. °· You have numbness, tingling, or pain in your feet or hands. °· You have chest pain. °· Your symptoms get worse even though you have been following your caregiver's orders. °· If you have any other questions or concerns. °  °This information is not intended to replace advice given to you by your health care provider. Make sure you discuss any questions you have with your health care provider. °  °Document Released: 12/26/2000 Document Revised: 09/24/2011 Document Reviewed: 03/08/2015 °Elsevier Interactive Patient Education ©2016 [employer] Inc[employer] ° °

## 2015-11-24 ENCOUNTER — Encounter (HOSPITAL_COMMUNITY): Payer: Self-pay

## 2015-11-24 ENCOUNTER — Emergency Department (HOSPITAL_COMMUNITY)
Admission: EM | Admit: 2015-11-24 | Discharge: 2015-11-24 | Disposition: A | Discharge status: Home / Self Care | Payer: 59 | Attending: Emergency Medicine | Admitting: Emergency Medicine

## 2015-11-24 DIAGNOSIS — Z7984 Long term (current) use of oral hypoglycemic drugs: Secondary | ICD-10-CM | POA: Insufficient documentation

## 2015-11-24 DIAGNOSIS — F329 Major depressive disorder, single episode, unspecified: Secondary | ICD-10-CM | POA: Insufficient documentation

## 2015-11-24 DIAGNOSIS — Z791 Long term (current) use of non-steroidal anti-inflammatories (NSAID): Secondary | ICD-10-CM | POA: Insufficient documentation

## 2015-11-24 DIAGNOSIS — F419 Anxiety disorder, unspecified: Secondary | ICD-10-CM | POA: Insufficient documentation

## 2015-11-24 DIAGNOSIS — Z79899 Other long term (current) drug therapy: Secondary | ICD-10-CM | POA: Diagnosis not present

## 2015-11-24 DIAGNOSIS — F439 Reaction to severe stress, unspecified: Secondary | ICD-10-CM | POA: Diagnosis not present

## 2015-11-24 DIAGNOSIS — E119 Type 2 diabetes mellitus without complications: Secondary | ICD-10-CM | POA: Insufficient documentation

## 2015-11-24 MED ORDER — LORAZEPAM 1 MG PO TABS: 1.0000 mg | ORAL_TABLET | Freq: Three times a day (TID) | ORAL | Status: DC | PRN

## 2015-11-24 MED ORDER — ONDANSETRON 4 MG PO TBDP: 4.0000 mg | ORAL_TABLET | Freq: Three times a day (TID) | ORAL | Status: DC | PRN

## 2015-11-24 NOTE — ED Provider Notes (Signed)
CSN: [id number]     Arrival date & time 11/24/15  1834 History   First MD Initiated Contact with Patient 11/24/15 2035     Chief Complaint  Patient presents with  . Anxiety     (Consider location/radiation/quality/duration/timing/severity/associated sxs/prior Treatment) HPI   Patient to the ER with complaints of Migraines, anxiety, depression and diabetes. She comes to the her having severe anxiety and wanting something to help with her sleep and her anxiety. She recently lost her job, was diagnosed with endometriosis and a lump on her breast. Patient reports feeling as though she is falling apart. She reports trying to find another job and has a provider who is going to evaluate her breast lump and treat her endometriosis. She had been taking Ativan but recently took her last one this past Sunday.   She takes Lexapro and reports she has been taking. She denies feeling SI/ HI, AVH, denies substance abuse or alcohol abuse.  Past Medical History  Diagnosis Date  . Migraines   . Anxiety   . Depression   . Diabetes (HCC) 11/03/2013   Past Surgical History  Procedure Laterality Date  . Wisdom tooth extraction    . Intrauterine device (iud) insertion      paraguard iud inserted 02/2013   Family History  Problem Relation Age of Onset  . Diabetes Mother   . Hypertension Mother   . Bipolar disorder Mother   . Diabetes Brother   . Breast cancer Maternal Aunt   . Diabetes Maternal Grandfather   . Diabetes Paternal Grandfather   . Heart attack Paternal Grandfather   . Breast cancer Maternal Aunt    Social History  Substance Use Topics  . Smoking status: Never Smoker   . Smokeless tobacco: Never Used  . Alcohol Use: No     Comment: occ   OB History    Gravida Para Term Preterm AB TAB SAB Ectopic Multiple Living       Review of Systems  Review of Systems All other systems negative except as documented in the HPI. All pertinent positives and negatives as  reviewed in the HPI.   Allergies  Fioricet and Amoxicillin  Home Medications   Prior to Admission medications   Medication Sig Start Date End Date Taking? Authorizing Provider  acetaminophen (TYLENOL) 500 MG tablet Take 2 tablets (1,000 mg total) by mouth every 8 (eight) hours as needed. Take 2 tabs every 8 hours. Patient not taking: Reported on 10/13/2015 06/25/14   Ofilia Neas, PA-C  Canagliflozin (INVOKANA) 300 MG TABS Take 1 tablet (300 mg total) by mouth daily. Patient not taking: Reported on 10/13/2015 11/03/13   Romero Belling, MD  escitalopram (LEXAPRO) 20 MG tablet Take 20 mg by mouth daily.    Historical Provider, MD  Guaifenesin (MUCINEX MAXIMUM STRENGTH) 1200 MG TB12 Take 1 tablet (1,200 mg total) by mouth every 12 (twelve) hours as needed. Patient not taking: Reported on 04/13/2015 06/25/14   Ofilia Neas, PA-C  ibuprofen (ADVIL,MOTRIN) 200 MG tablet Take 600 mg by mouth every 6 (six) hours as needed for moderate pain.    Historical Provider, MD  ibuprofen (ADVIL,MOTRIN) 800 MG tablet Take 1 tab every eight hours for pain Patient not taking: Reported on 10/13/2015 06/25/14   Ofilia Neas, PA-C  LORazepam (ATIVAN) 0.5 MG tablet Take 1 tablet (0.5 mg total) by mouth 3 (three) times daily as needed for anxiety. Patient not taking: Reported  on 10/13/2015 04/13/15   Elpidio AnisShari Upstill, PA-C  LORazepam (ATIVAN) 1 MG tablet Take 1 tablet (1 mg total) by mouth 3 (three) times daily as needed for anxiety. 11/24/15   Dago Jungwirth Neva SeatGreene, PA-C  metFORMIN (GLUCOPHAGE-XR) 500 MG 24 hr tablet Take 500 mg by mouth daily with breakfast.  11/03/13   Romero BellingSean Ellison, MD  misoprostol (CYTOTEC) 200 MCG tablet Take Cytotec 200 mcg tablet. 1 tablet night before the procedure. 1 tablet the morning of the procedure. Patient not taking: Reported on 04/13/2015 09/23/14   Patton SallesBrook E Amundson C Silva, MD  ondansetron (ZOFRAN ODT) 4 MG disintegrating tablet Take 1 tablet (4 mg total) by mouth every 8 (eight) hours as needed  for nausea or vomiting. 11/24/15   Marlon Peliffany Manie Bealer, PA-C  ondansetron (ZOFRAN) 4 MG tablet Take 1 tablet (4 mg total) by mouth every 6 (six) hours. 10/13/15   Elpidio AnisShari Upstill, PA-C  PARAGARD INTRAUTERINE COPPER IU 1 each by Intrauterine route continuous.     Historical Provider, MD  pioglitazone (ACTOS) 45 MG tablet Take 1 tablet (45 mg total) by mouth daily. 11/03/13   Romero BellingSean Ellison, MD   BP 147/89 mmHg  Pulse 73  Temp(Src) 97.9 F (36.6 C) (Oral)  Resp 20  SpO2 100% Physical Exam  Constitutional: She appears well-developed and well-nourished. No distress.  HENT:  Head: Normocephalic and atraumatic.  Eyes: Pupils are equal, round, and reactive to light.  Neck: Normal range of motion. Neck supple.  Cardiovascular: Normal rate and regular rhythm.   Pulmonary/Chest: Effort normal.  Abdominal: Soft.  Neurological: She is alert.  Skin: Skin is warm and dry.  Psychiatric: Her speech is normal. Her mood appears anxious. She is not withdrawn and not actively hallucinating. Thought content is not paranoid. She exhibits a depressed mood. She expresses no homicidal and no suicidal ideation. She expresses no suicidal plans and no homicidal plans. She is attentive.  Nursing note and vitals reviewed.   ED Course  Procedures (including critical care time) Labs Review Labs Reviewed - No data to display  Imaging Review No results found. I have personally reviewed and evaluated these images and lab results as part of my medical decision-making.   EKG Interpretation None      MDM   Final diagnoses:  Anxiety  Stress   Patient not having SI/HI, AVH, substance abuse. She requests some Ativan and Zofran until she can see her PCP who manages her Lexapro early next week. Denies wanting to talk to TTS or psych here. Patient has plans for a job interview tomorrow and followup for her medical problems with womens and f/u with her PCP next week.  Medications - No data to display  I discussed results,  diagnoses and plan with J B Bray. They voice there understanding and questions were answered. We discussed follow-up recommendations and return precautions.     Marlon Peliffany Shadow Stiggers, PA-C 11/24/15 2109  Lorre NickAnthony Allen, MD 11/27/15 [phone]

## 2015-11-24 NOTE — Discharge Instructions (Signed)
Stress and Stress Management °Stress is a normal reaction to life events. It is what you feel when life demands more than you are used to or more than you can handle. Some stress can be useful. For example, the stress reaction can help you catch the last bus of the day, study for a test, or meet a deadline at work. But stress that occurs too often or for too long can cause problems. It can affect your emotional health and interfere with relationships and normal daily activities. Too much stress can weaken your immune system and increase your risk for physical illness. If you already have a medical problem, stress can make it worse. °CAUSES  °All sorts of life events may cause stress. An event that causes stress for one person may not be stressful for another person. Major life events commonly cause stress. These may be positive or negative. Examples include losing your job, moving into a new home, getting married, having a baby, or losing a loved one. Less obvious life events may also cause stress, especially if they occur day after day or in combination. Examples include working long hours, driving in traffic, caring for children, being in debt, or being in a difficult relationship. °SIGNS AND SYMPTOMS °Stress may cause emotional symptoms including, the following: °· Anxiety. This is feeling worried, afraid, on edge, overwhelmed, or out of control. °· Anger. This is feeling irritated or impatient. °· Depression. This is feeling sad, down, helpless, or guilty. °· Difficulty focusing, remembering, or making decisions. °Stress may cause physical symptoms, including the following:  °· Aches and pains. These may affect your head, neck, back, stomach, or other areas of your body. °· Tight muscles or clenched jaw. °· Low energy or trouble sleeping.  °Stress may cause unhealthy behaviors, including the following:  °· Eating to feel better (overeating) or skipping meals. °· Sleeping too little, too much, or both. °· Working  too much or putting off tasks (procrastination). °· Smoking, drinking alcohol, or using drugs to feel better. °DIAGNOSIS  °Stress is diagnosed through an assessment by your health care provider. Your health care provider will ask questions about your symptoms and any stressful life events. Your health care provider will also ask about your medical history and may order blood tests or other tests. Certain medical conditions and medicine can cause physical symptoms similar to stress.  Mental illness can cause emotional symptoms and unhealthy behaviors similar to stress. Your health care provider may refer you to a mental health professional for further evaluation.  °TREATMENT  °Stress management is the recommended treatment for stress. The goals of stress management are reducing stressful life events and coping with stress in healthy ways.  °Techniques for reducing stressful life events include the following: °· Stress identification. Self-monitor for stress and identify what causes stress for you. These skills may help you to avoid some stressful events. °· Time management. Set your priorities, keep a calendar of events, and learn to say "no." These tools can help you avoid making too many commitments. °Techniques for coping with stress include the following: °· Rethinking the problem. Try to think realistically about stressful events rather than ignoring them or overreacting. Try to find the positives in a stressful situation rather than focusing on the negatives. °· Exercise. Physical exercise can release both physical and emotional tension. The key is to find a form of exercise you enjoy and do it regularly. °· Relaxation techniques. These relax the body and mind. Examples include yoga, meditation, tai chi, biofeedback, deep   breathing, progressive muscle relaxation, listening to music, being out in nature, journaling, and other hobbies. Again, the key is to find one or more that you enjoy and can do  regularly.  Healthy lifestyle. Eat a balanced diet, get plenty of sleep, and do not smoke. Avoid using alcohol or drugs to relax.  Strong support network. Spend time with family, friends, or other people you enjoy being around.Express your feelings and talk things over with someone you trust. Counseling or talktherapy with a mental health professional may be helpful if you are having difficulty managing stress on your own. Medicine is typically not recommended for the treatment of stress.Talk to your health care provider if you think you need medicine for symptoms of stress. HOME CARE INSTRUCTIONS  Keep all follow-up visits as directed by your health care provider.  Take all medicines as directed by your health care provider. SEEK MEDICAL CARE IF:  Your symptoms get worse or you start having new symptoms.  You feel overwhelmed by your problems and can no longer manage them on your own. SEEK IMMEDIATE MEDICAL CARE IF:  You feel like hurting yourself or someone else.   This information is not intended to replace advice given to you by your health care provider. Make sure you discuss any questions you have with your health care provider.   Document Released: 12/26/2000 Document Revised: 07/23/2014 Document Reviewed: 02/24/2013 Elsevier Interactive Patient Education [employer].

## 2015-11-24 NOTE — ED Notes (Signed)
Pt was fired today from her job, recently found out she has endometrosis and a lump on her breast, she feels like she's falling apart. Pt has a plan for another job and has an appt for follow up on her breast, pt took her last ativan Sunday and would like something for sleep and anxiety.

## 2016-01-16 ENCOUNTER — Emergency Department (HOSPITAL_COMMUNITY)
Admission: EM | Admit: 2016-01-16 | Discharge: 2016-01-16 | Disposition: A | Discharge status: Home / Self Care | Payer: 59 | Attending: Emergency Medicine | Admitting: Emergency Medicine

## 2016-01-16 ENCOUNTER — Encounter (HOSPITAL_COMMUNITY): Payer: Self-pay | Admitting: *Deleted

## 2016-01-16 DIAGNOSIS — T782XXA Anaphylactic shock, unspecified, initial encounter: Secondary | ICD-10-CM | POA: Diagnosis not present

## 2016-01-16 DIAGNOSIS — Z791 Long term (current) use of non-steroidal anti-inflammatories (NSAID): Secondary | ICD-10-CM | POA: Diagnosis not present

## 2016-01-16 DIAGNOSIS — R112 Nausea with vomiting, unspecified: Secondary | ICD-10-CM | POA: Diagnosis not present

## 2016-01-16 DIAGNOSIS — R197 Diarrhea, unspecified: Secondary | ICD-10-CM | POA: Diagnosis not present

## 2016-01-16 DIAGNOSIS — Z7984 Long term (current) use of oral hypoglycemic drugs: Secondary | ICD-10-CM | POA: Diagnosis not present

## 2016-01-16 DIAGNOSIS — Z79899 Other long term (current) drug therapy: Secondary | ICD-10-CM | POA: Diagnosis not present

## 2016-01-16 DIAGNOSIS — E119 Type 2 diabetes mellitus without complications: Secondary | ICD-10-CM | POA: Insufficient documentation

## 2016-01-16 DIAGNOSIS — F329 Major depressive disorder, single episode, unspecified: Secondary | ICD-10-CM | POA: Insufficient documentation

## 2016-01-16 DIAGNOSIS — T7840XA Allergy, unspecified, initial encounter: Secondary | ICD-10-CM | POA: Diagnosis present

## 2016-01-16 HISTORY — DX: Endometriosis, unspecified: N80.9

## 2016-01-16 LAB — CBC WITH DIFFERENTIAL/PLATELET
Basophils Absolute: 0 10*3/uL (ref 0.0–0.1)
Basophils Relative: 0 %
EOS ABS: 0.2 10*3/uL (ref 0.0–0.7)
EOS PCT: 2 %
HCT: 41.6 % (ref 36.0–46.0)
Hemoglobin: 14.5 g/dL (ref 12.0–15.0)
LYMPHS ABS: 3.7 10*3/uL (ref 0.7–4.0)
LYMPHS PCT: 41 %
MCH: 30.2 pg (ref 26.0–34.0)
MCHC: 34.9 g/dL (ref 30.0–36.0)
MCV: 86.7 fL (ref 78.0–100.0)
MONOS PCT: 9 %
Monocytes Absolute: 0.8 10*3/uL (ref 0.1–1.0)
Neutro Abs: 4.3 10*3/uL (ref 1.7–7.7)
Neutrophils Relative %: 48 %
PLATELETS: 297 10*3/uL (ref 150–400)
RBC: 4.8 MIL/uL (ref 3.87–5.11)
RDW: 13.4 % (ref 11.5–15.5)
WBC: 9.1 10*3/uL (ref 4.0–10.5)

## 2016-01-16 LAB — BASIC METABOLIC PANEL
Anion gap: 9 (ref 5–15)
BUN: 13 mg/dL (ref 6–20)
CHLORIDE: 105 mmol/L (ref 101–111)
CO2: 21 mmol/L — ABNORMAL LOW (ref 22–32)
CREATININE: 0.71 mg/dL (ref 0.44–1.00)
Calcium: 9.1 mg/dL (ref 8.9–10.3)
GFR calc Af Amer: >60 mL/min (ref >60)
GFR calc non Af Amer: >60 mL/min (ref >60)
GLUCOSE: 315 mg/dL — AB (ref 65–99)
Potassium: 3.6 mmol/L (ref 3.5–5.1)
SODIUM: 135 mmol/L (ref 135–145)

## 2016-01-16 MED ORDER — DIPHENHYDRAMINE HCL 50 MG/ML IJ SOLN
25.0000 mg | Freq: Once | INTRAMUSCULAR | Status: AC
  Administered 2016-01-16: 25 mg via INTRAVENOUS
  Filled 2016-01-16: qty 1

## 2016-01-16 MED ORDER — SODIUM CHLORIDE 0.9 % IV BOLUS (SEPSIS)
1000.0000 mL | Freq: Once | INTRAVENOUS | Status: AC
Start: 2016-01-16 — End: 2016-01-16
  Administered 2016-01-16: 1000 mL via INTRAVENOUS

## 2016-01-16 MED ORDER — METHYLPREDNISOLONE SODIUM SUCC 125 MG IJ SOLR
125.0000 mg | Freq: Once | INTRAMUSCULAR | Status: AC
  Administered 2016-01-16: 125 mg via INTRAVENOUS
  Filled 2016-01-16: qty 2

## 2016-01-16 MED ORDER — FAMOTIDINE IN NACL 20-0.9 MG/50ML-% IV SOLN
20.0000 mg | Freq: Once | INTRAVENOUS | Status: AC
  Administered 2016-01-16: 20 mg via INTRAVENOUS
  Filled 2016-01-16: qty 50

## 2016-01-16 MED ORDER — EPINEPHRINE 0.3 MG/0.3ML IJ SOAJ
0.3000 mg | Freq: Once | INTRAMUSCULAR | Status: AC
  Administered 2016-01-16: 0.3 mg via INTRAMUSCULAR
  Filled 2016-01-16: qty 0.3

## 2016-01-16 MED ORDER — PREDNISONE 10 MG PO TABS: 20.0000 mg | ORAL_TABLET | Freq: Every day | ORAL | Status: DC

## 2016-01-16 MED ORDER — EPINEPHRINE 0.3 MG/0.3ML IJ SOAJ: 0.3000 mg | Freq: Once | INTRAMUSCULAR | Status: AC

## 2016-01-16 MED ORDER — DIPHENHYDRAMINE HCL 25 MG PO TABS: 25.0000 mg | ORAL_TABLET | Freq: Four times a day (QID) | ORAL | Status: DC

## 2016-01-16 NOTE — ED Provider Notes (Signed)
CSN: [id number]     Arrival date & time 01/16/16  0049 History  By signing my name below, I, Tamara HedgerElizabeth Robles, attest that this documentation has been prepared under the direction and in the presence of non-physician practitioner, Dorthula Matasiffany G Damond Borchers, PA-C Electronically Signed: Levon HedgerElizabeth Robles, Scribe. 01/16/2016. 1:35 AM   Chief Complaint  Patient presents with  . Allergic Reaction   The history is provided by the patient. No language interpreter was used.    HPI Comments:  Tamara Robles is a 25 y.o. female who presents to the Emergency Department complaining of hives sudden onset two hours ago. Pt states she ate fish, eggplant and a chocolate chip cookie sandwich ~2.5 hours ago. 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 Pt has never had to use EpiPen before. Pt complains of associated throat itching, erythema, diarrhea, vomiting, nausea. Pt states she threw up 1x since arrival. She had a red rash to face and chest, as well as red eyes.  Pt states she took benadryl 50 mg PTA with no relief. She denies any swelling, difficulty swallowing, or throat tightness, SOB, loc, urinary retention, headache, back pain or change in vision.   Past Medical History  Diagnosis Date  . Migraines   . Anxiety   . Depression   . Diabetes (HCC) 11/03/2013  . Endometriosis    Past Surgical History  Procedure Laterality Date  . Wisdom tooth extraction    . Intrauterine device (iud) insertion      paraguard iud inserted 02/2013   Family History  Problem Relation Age of Onset  . Diabetes Mother   . Hypertension Mother   . Bipolar disorder Mother   . Diabetes Brother   . Breast cancer Maternal Aunt   . Diabetes Maternal Grandfather   . Diabetes Paternal Grandfather   . Heart attack Paternal Grandfather   . Breast cancer Maternal Aunt    Social History  Substance Use Topics  . Smoking status: Never Smoker   . Smokeless tobacco: Never Used  .  Alcohol Use: No     Comment: occ   OB History    Gravida Para Term Preterm AB TAB SAB Ectopic Multiple Living   0 0 0 0 0 0 0 0 0 0      Review of Systems  Constitutional: Negative for fever.  HENT: Negative for trouble swallowing.        -throat tightness  Gastrointestinal: Positive for nausea, vomiting and diarrhea.  Skin: Positive for color change.  Allergic/Immunologic: Positive for food allergies.  All other systems reviewed and are negative.   Allergies  Cherry extract; Fioricet; Orange fruit; Amoxicillin; and Pork-derived products  Home Medications   Prior to Admission medications   Medication Sig Start Date End Date Taking? Authorizing Provider  escitalopram (LEXAPRO) 20 MG tablet Take 20 mg by mouth daily.   Yes Historical Provider, MD  ibuprofen (ADVIL,MOTRIN) 200 MG tablet Take 400-800 mg by mouth every 6 (six) hours as needed for moderate pain.    Yes Historical Provider, MD  LORazepam (ATIVAN) 1 MG tablet Take 1 tablet (1 mg total) by mouth 3 (three) times daily as needed for anxiety. 11/24/15  Yes Amareon Phung Neva SeatGreene, PA-C  metFORMIN (GLUCOPHAGE-XR) 500 MG 24 hr tablet Take 500 mg by mouth 2 (two) times daily.  11/03/13  Yes Romero BellingSean Ellison, MD  NALTREXONE HCL PO Take 1.5 mg by mouth daily.   Yes Historical Provider, MD  PARAGARD INTRAUTERINE COPPER IU 1 each by Intrauterine route continuous.    Yes Historical Provider, MD  diphenhydrAMINE (BENADRYL) 25 MG tablet Take 1 tablet (25 mg total) by mouth every 6 (six) hours. 01/16/16   Kimiya Brunelle Neva SeatGreene, PA-C  EPINEPHrine 0.3 mg/0.3 mL IJ SOAJ injection Inject 0.3 mLs (0.3 mg total) into the muscle once. 01/16/16   Charniece Venturino Neva SeatGreene, PA-C  ondansetron (ZOFRAN ODT) 4 MG disintegrating tablet Take 1 tablet (4 mg total) by mouth every 8 (eight) hours as needed for nausea or vomiting. Patient not taking: Reported on 01/16/2016 11/24/15   Marlon Peliffany Niyla Marone, PA-C  ondansetron (ZOFRAN) 4 MG tablet Take 1 tablet (4 mg total) by mouth every 6 (six)  hours. Patient not taking: Reported on 01/16/2016 10/13/15   Elpidio AnisShari Upstill, PA-C  predniSONE (DELTASONE) 10 MG tablet Take 2 tablets (20 mg total) by mouth daily. 01/16/16   Leeland Lovelady Neva SeatGreene, PA-C   BP 108/55 mmHg  Pulse 75  Temp(Src) 98.1 F (36.7 C) (Oral)  Resp 18  SpO2 100%  LMP 01/15/2016 (Exact Date) Physical Exam  Constitutional: She is oriented to person, place, and time. She appears well-developed and well-nourished. No distress.  HENT:  Head: Normocephalic and atraumatic.  Right Ear: Tympanic membrane and ear canal normal.  Left Ear: Tympanic membrane and ear canal normal.  Nose: Nose normal.  Mouth/Throat: Uvula is midline, oropharynx is clear and moist and mucous membranes are normal.  Face is flushed, as well as chest wall and bilateral upper extremities.  Eyes: EOM are normal. Right conjunctiva is injected. Right conjunctiva has no hemorrhage. Left conjunctiva is injected. Left conjunctiva has no hemorrhage.  Cardiovascular: Normal rate.   Pulmonary/Chest: Effort normal.  Abdominal: She exhibits no distension.  Pt having intermittent vomiting and fecal urgency  Neurological: She is alert and oriented to person, place, and time. No cranial nerve deficit or sensory deficit.  Skin: Skin is warm and dry.  Psychiatric: She has a normal mood and affect.  Nursing note and vitals reviewed.   ED Course  Procedures  DIAGNOSTIC STUDIES:  Oxygen Saturation is 100% on RA, normal by my interpretation.    COORDINATION OF CARE:  1:29 AM Will order Epi-Pen, benadryl, solumedrol, Pepcid, CBC, and BMP. Discussed treatment plan with pt at bedside and pt agreed to plan.  Labs Review Labs Reviewed  BASIC METABOLIC PANEL - Abnormal; Notable for the following:    CO2 21 (*)    Glucose, Bld 315 (*)    All other components within normal limits  CBC WITH DIFFERENTIAL/PLATELET    Imaging Review No results found. I have personally reviewed and evaluated these images and lab results as  part of my medical decision-making.   EKG Interpretation None      MDM   Final diagnoses:  Anaphylaxis, initial encounter    Medications  sodium chloride 0.9 % bolus 1,000 mL (0 mLs Intravenous Stopped 01/16/16 0240)  methylPREDNISolone sodium succinate (SOLU-MEDROL) 125 mg/2 mL injection 125 mg (125 mg Intravenous Given 01/16/16 0127)  diphenhydrAMINE (BENADRYL) injection 25 mg (25 mg Intravenous Given 01/16/16 0125)  famotidine (PEPCID) IVPB 20 mg premix (0 mg Intravenous Stopped 01/16/16 0214)  EPINEPHrine (EPI-PEN) injection 0.3 mg (0.3 mg Intramuscular Given 01/16/16 0134)    CRITICAL CARE Performed by: Dorthula MatasGREENE,Javares Kaufhold G Total critical care time: 35 minutes Critical care time was exclusive of separately billable procedures and treating other patients. Critical care was necessary to treat or prevent imminent or life-threatening deterioration. Critical care was time spent personally by me  on the following activities: development of treatment plan with patient and/or surrogate as well as nursing, discussions with consultants, evaluation of patient's response to treatment, examination of patient, obtaining history from patient or surrogate, ordering and performing treatments and interventions, ordering and review of laboratory studies, ordering and review of radiographic studies, pulse oximetry and re-evaluation of patient's condition.  Patient given EPI for anaphylaxis. She showed significant improvement after intervention and was monitored for 3 hours. Continues to do well. Dr. Hyacinth Meeker has seen the patient as well. We recommend allergy testing to be ordered by her PCP.  Rx: prednisone, Benadryl, and Epi Pen  I personally performed the services described in this documentation, which was scribed in my presence. The recorded information has been reviewed and is accurate.     Marlon Pel, PA-C 01/16/16 1610  Eber Hong, MD 01/16/16 [phone]

## 2016-01-16 NOTE — ED Notes (Signed)
Per pt report: pt ate fish, eggplant, and a chocolate chip cookie sandwich about 2.5 hours ago this evening.  Pt began having itching and redness all over her body.  Pt reports having an itchy throat.  Pt speaking in complete sentences without difficulty.  Pt took 50mg  benadryl about an hour ago.

## 2016-01-16 NOTE — ED Provider Notes (Signed)
The patient is a 25 year old female, she has known history of allergies to several substances including oranges, cherries and pork. She reports that after eating a tuna steak and egg plant with cheese melted shortly thereafter she developed vomiting and diarrhea that a diffuse erythematous itchy rash and a scratchiness in her throat which prompted her visit to the emergency department  On exam after  receiving epinephrine the patient has almost complete resolution of her rash, she is not tachycardic but does have some hypertension.  she will be observed in the emergency department, I have strongly encouraged her to get allergy testing and her family doctor or allergist office. She expressed understanding. She has no hypotension, no respiratory symptoms  Medical screening examination/treatment/procedure(s) were conducted as a shared visit with non-physician practitioner(s) and myself.  I personally evaluated the patient during the encounter.  Clinical Impression:   Final diagnoses:  Anaphylaxis, initial encounter       ]  Eber HongBrian Corinda Ammon, MD 01/16/16 [phone]

## 2016-01-16 NOTE — Discharge Instructions (Signed)

## 2016-03-12 ENCOUNTER — Emergency Department (HOSPITAL_COMMUNITY)
Admission: EM | Admit: 2016-03-12 | Discharge: 2016-03-12 | Disposition: A | Discharge status: Home / Self Care | Payer: 59 | Attending: Emergency Medicine | Admitting: Emergency Medicine

## 2016-03-12 ENCOUNTER — Encounter (HOSPITAL_COMMUNITY): Payer: Self-pay | Admitting: Emergency Medicine

## 2016-03-12 DIAGNOSIS — E119 Type 2 diabetes mellitus without complications: Secondary | ICD-10-CM | POA: Diagnosis not present

## 2016-03-12 DIAGNOSIS — Z7984 Long term (current) use of oral hypoglycemic drugs: Secondary | ICD-10-CM | POA: Diagnosis not present

## 2016-03-12 DIAGNOSIS — T7840XA Allergy, unspecified, initial encounter: Secondary | ICD-10-CM | POA: Insufficient documentation

## 2016-03-12 DIAGNOSIS — Z7952 Long term (current) use of systemic steroids: Secondary | ICD-10-CM | POA: Diagnosis not present

## 2016-03-12 DIAGNOSIS — Z79899 Other long term (current) drug therapy: Secondary | ICD-10-CM | POA: Diagnosis not present

## 2016-03-12 MED ORDER — FAMOTIDINE 20 MG PO TABS
40.0000 mg | ORAL_TABLET | Freq: Once | ORAL | Status: AC
  Administered 2016-03-12: 40 mg via ORAL
  Filled 2016-03-12: qty 2

## 2016-03-12 MED ORDER — ONDANSETRON 4 MG PO TBDP
4.0000 mg | ORAL_TABLET | Freq: Once | ORAL | Status: AC
  Administered 2016-03-12: 4 mg via ORAL
  Filled 2016-03-12: qty 1

## 2016-03-12 MED ORDER — PREDNISONE 20 MG PO TABS: ORAL_TABLET | ORAL | 0 refills | Status: DC

## 2016-03-12 MED ORDER — DIPHENHYDRAMINE HCL 25 MG PO CAPS
50.0000 mg | ORAL_CAPSULE | Freq: Once | ORAL | Status: AC
  Administered 2016-03-12: 50 mg via ORAL
  Filled 2016-03-12: qty 2

## 2016-03-12 MED ORDER — PREDNISONE 20 MG PO TABS
60.0000 mg | ORAL_TABLET | Freq: Once | ORAL | Status: AC
  Administered 2016-03-12: 60 mg via ORAL
  Filled 2016-03-12: qty 3

## 2016-03-12 NOTE — ED Provider Notes (Signed)
[facility]-EMERGENCY DEPT Provider Note   CSN: [id number] Arrival date & time: 03/12/16  0306   By signing my name below, I, Tamara Robles, attest that this documentation has been prepared under the direction and in the presence of Tamara Plan, DO . Electronically Signed: Nelwyn Robles, Scribe. 03/12/2016. 3:20 AM.   History   Chief Complaint Chief Complaint  Patient presents with  . Allergic Reaction   The history is provided by the patient. No language interpreter was used.     HPI Comments:  Tamara Robles is a 25 y.o. female with PMHx of DM who presents to the Emergency Department complaining of sudden-onset improving allergic reaction onset a few hours ago. She reports her symptoms began after she ate some fast food. She notes associated diffuse erythema, pruritis, vomiting, diarrhea, abdominal pain, light-headedness and resolved shortness of breath. She denies any sick contacts or contact with any known allergens. Pt states she used her inhaler which improved her symptoms.    Past Medical History:  Diagnosis Date  . Anxiety   . Depression   . Diabetes (HCC) 11/03/2013   Type II  . Endometriosis   . Migraines     Patient Active Problem List   Diagnosis Date Noted  . Encounter for long-term (current) use of other medications 12/03/2013  . NASH (nonalcoholic steatohepatitis) 11/03/2013  . Diabetes (HCC) 11/03/2013  . Migraine headache without aura 10/20/2013    Class: History of  . Obesity, unspecified 10/20/2013    Class: History of    Past Surgical History:  Procedure Laterality Date  . INTRAUTERINE DEVICE (IUD) INSERTION     paraguard iud inserted 02/2013  . WISDOM TOOTH EXTRACTION      OB History    Gravida Para Term Preterm AB Living   0 0 0 0 0 0   SAB TAB Ectopic Multiple Live Births   0 0 0 0         Home Medications    Prior to Admission medications   Medication Sig Start Date End Date Taking? Authorizing Provider  diphenhydrAMINE (BENADRYL) 25 MG  tablet Take 1 tablet (25 mg total) by mouth every 6 (six) hours. 01/16/16   Tiffany Neva Seat, PA-C  EPINEPHrine 0.3 mg/0.3 mL IJ SOAJ injection Inject 0.3 mLs (0.3 mg total) into the muscle once. 01/16/16   Tiffany Neva Seat, PA-C  escitalopram (LEXAPRO) 20 MG tablet Take 20 mg by mouth daily.    Historical Provider, MD  ibuprofen (ADVIL,MOTRIN) 200 MG tablet Take 400-800 mg by mouth every 6 (six) hours as needed for moderate pain.     Historical Provider, MD  LORazepam (ATIVAN) 1 MG tablet Take 1 tablet (1 mg total) by mouth 3 (three) times daily as needed for anxiety. 11/24/15   Tiffany Neva Seat, PA-C  metFORMIN (GLUCOPHAGE-XR) 500 MG 24 hr tablet Take 500 mg by mouth 2 (two) times daily.  11/03/13   Romero Belling, MD  NALTREXONE HCL PO Take 1.5 mg by mouth daily.    Historical Provider, MD  ondansetron (ZOFRAN ODT) 4 MG disintegrating tablet Take 1 tablet (4 mg total) by mouth every 8 (eight) hours as needed for nausea or vomiting. Patient not taking: Reported on 01/16/2016 11/24/15   Marlon Pel, PA-C  ondansetron (ZOFRAN) 4 MG tablet Take 1 tablet (4 mg total) by mouth every 6 (six) hours. Patient not taking: Reported on 01/16/2016 10/13/15   Elpidio Anis, PA-C  PARAGARD INTRAUTERINE COPPER IU 1 each by Intrauterine route continuous.     Historical  Provider, MD  predniSONE (DELTASONE) 10 MG tablet Take 2 tablets (20 mg total) by mouth daily. 01/16/16   Tiffany Neva Seat, PA-C  predniSONE (DELTASONE) 20 MG tablet 2 tabs po daily x 4 days 03/12/16   Tamara Plan, DO    Family History Family History  Problem Relation Age of Onset  . Diabetes Mother   . Hypertension Mother   . Bipolar disorder Mother   . Breast cancer Maternal Aunt   . Diabetes Paternal Grandfather   . Heart attack Paternal Grandfather   . Breast cancer Maternal Aunt   . Diabetes Brother   . Diabetes Maternal Grandfather     Social History Social History  Substance Use Topics  . Smoking status: Never Smoker  . Smokeless tobacco: Never Used   . Alcohol use No     Allergies   Cherry extract; Fioricet [butalbital-apap-caffeine]; Orange fruit [citrus]; Amoxicillin; and Pork-derived products   Review of Systems Review of Systems  Respiratory: Negative for shortness of breath (Resolved).   Gastrointestinal: Positive for abdominal pain, diarrhea and vomiting.  Skin: Positive for color change.       Positive Itchiness  Allergic/Immunologic: Positive for environmental allergies and food allergies.  Neurological: Positive for light-headedness.     Physical Exam Updated Vital Signs BP 133/85 (BP Location: Right Arm)   Pulse 81   Temp 97.5 F (36.4 C) (Oral)   Resp 16   Ht 5\' 5"  (1.651 m)   Wt 235 lb (106.6 kg)   LMP 02/27/2016   SpO2 99%   BMI 39.11 kg/m   Physical Exam  Constitutional: She is oriented to person, place, and time. She appears well-developed and well-nourished. No distress.  HENT:  Head: Normocephalic and atraumatic.  Eyes: EOM are normal. Pupils are equal, round, and reactive to light.  Neck: Normal range of motion. Neck supple.  Cardiovascular: Normal rate and regular rhythm.  Exam reveals no gallop and no friction rub.   No murmur heard. Pulmonary/Chest: Effort normal. She has no wheezes. She has no rales.  No wheezing.  Abdominal: Soft. She exhibits no distension. There is no tenderness.  No abdominal tenderness.  Musculoskeletal: She exhibits no edema or tenderness.  Neurological: She is alert and oriented to person, place, and time.  Skin: Skin is warm and dry. She is not diaphoretic. There is erythema.  Mild erythema  Psychiatric: She has a normal mood and affect. Her behavior is normal.  Nursing note and vitals reviewed.    ED Treatments / Results  DIAGNOSTIC STUDIES:  Oxygen Saturation is 100% on RA, normal by my interpretation.    COORDINATION OF CARE:  3:17 AM Discussed treatment Robles with pt at bedside which included steroids and pt agreed to Robles.  Labs (all labs ordered  are listed, but only abnormal results are displayed) Labs Reviewed - No data to display  EKG  EKG Interpretation None       Radiology No results found.  Procedures Procedures (including critical care time)  Medications Ordered in ED Medications  predniSONE (DELTASONE) tablet 60 mg (60 mg Oral Given 03/12/16 0325)  famotidine (PEPCID) tablet 40 mg (40 mg Oral Given 03/12/16 0325)  diphenhydrAMINE (BENADRYL) capsule 50 mg (50 mg Oral Given 03/12/16 0325)  ondansetron (ZOFRAN-ODT) disintegrating tablet 4 mg (4 mg Oral Given 03/12/16 0326)     Initial Impression / Assessment and Robles / ED Course  I have reviewed the triage vital signs and the nursing notes.  Pertinent labs & imaging results that were  available during my care of the patient were reviewed by me and considered in my medical decision making (see chart for details).  Clinical Course    25 yo F with possible allergic reaction.  No wheezing, no vomiting or diarrhea while in the ED. Given prednisone, h2 blockers with subjective improvement.  D/c home.   4:38 AM:  I have discussed the diagnosis/risks/treatment options with the patient and believe the pt to be eligible for discharge home to follow-up with PCP. We also discussed returning to the ED immediately if new or worsening sx occur. We discussed the sx which are most concerning (e.g., sudden worsening pain, fever, inability to tolerate by mouth) that necessitate immediate return. Medications administered to the patient during their visit and any new prescriptions provided to the patient are listed below.  Medications given during this visit Medications  predniSONE (DELTASONE) tablet 60 mg (60 mg Oral Given 03/12/16 0325)  famotidine (PEPCID) tablet 40 mg (40 mg Oral Given 03/12/16 0325)  diphenhydrAMINE (BENADRYL) capsule 50 mg (50 mg Oral Given 03/12/16 0325)  ondansetron (ZOFRAN-ODT) disintegrating tablet 4 mg (4 mg Oral Given 03/12/16 0326)     The patient appears  reasonably screen and/or stabilized for discharge and I doubt any other medical condition or other [facility] requiring further screening, evaluation, or treatment in the ED at this time prior to discharge.    Final Clinical Impressions(s) / ED Diagnoses   Final diagnoses:  Allergic reaction, initial encounter    New Prescriptions Discharge Medication List as of 03/12/2016  4:24 AM     I personally performed the services described in this documentation, which was scribed in my presence. The recorded information has been reviewed and is accurate.     Tamara Planan Che Below, DO 03/12/16 [phone]

## 2016-03-12 NOTE — ED Notes (Signed)
Pt ambulatory and independent at discharge.  Verbalized understanding of discharge instructions 

## 2016-03-12 NOTE — ED Triage Notes (Signed)
MD at bedside. 

## 2016-03-12 NOTE — ED Triage Notes (Signed)
Pt came from home via EMS stating she went to [employer] and ordered chicken.  She is allergic to pork and is not sure if there was cross contamination.  Pt positive for emesis, dizziness, and shortness of breath during reaction.  She states she was also red and itchy.  Was given 50 of benadryl in route.

## 2016-03-12 NOTE — ED Notes (Signed)
Bed: WA17 Expected date:  Expected time:  Means of arrival:  Comments: EMS 25 yo female allergic reaction

## 2016-05-12 ENCOUNTER — Encounter (HOSPITAL_COMMUNITY): Payer: Self-pay | Admitting: Emergency Medicine

## 2016-05-12 ENCOUNTER — Emergency Department (HOSPITAL_COMMUNITY)
Admission: EM | Admit: 2016-05-12 | Discharge: 2016-05-12 | Disposition: A | Discharge status: Home / Self Care | Payer: 59 | Attending: Emergency Medicine | Admitting: Emergency Medicine

## 2016-05-12 DIAGNOSIS — K529 Noninfective gastroenteritis and colitis, unspecified: Secondary | ICD-10-CM | POA: Insufficient documentation

## 2016-05-12 DIAGNOSIS — E119 Type 2 diabetes mellitus without complications: Secondary | ICD-10-CM | POA: Insufficient documentation

## 2016-05-12 DIAGNOSIS — J029 Acute pharyngitis, unspecified: Secondary | ICD-10-CM | POA: Diagnosis not present

## 2016-05-12 DIAGNOSIS — Z7984 Long term (current) use of oral hypoglycemic drugs: Secondary | ICD-10-CM | POA: Insufficient documentation

## 2016-05-12 DIAGNOSIS — Z79899 Other long term (current) drug therapy: Secondary | ICD-10-CM | POA: Insufficient documentation

## 2016-05-12 LAB — BASIC METABOLIC PANEL
ANION GAP: 8 (ref 5–15)
BUN: 14 mg/dL (ref 6–20)
CO2: 21 mmol/L — ABNORMAL LOW (ref 22–32)
Calcium: 8.8 mg/dL — ABNORMAL LOW (ref 8.9–10.3)
Chloride: 107 mmol/L (ref 101–111)
Creatinine, Ser: 0.64 mg/dL (ref 0.44–1.00)
Glucose, Bld: 260 mg/dL — ABNORMAL HIGH (ref 65–99)
POTASSIUM: 3.9 mmol/L (ref 3.5–5.1)
SODIUM: 136 mmol/L (ref 135–145)

## 2016-05-12 LAB — CBC WITH DIFFERENTIAL/PLATELET
BASOS ABS: 0 10*3/uL (ref 0.0–0.1)
BASOS PCT: 0 %
EOS ABS: 0.2 10*3/uL (ref 0.0–0.7)
Eosinophils Relative: 3 %
HCT: 41.6 % (ref 36.0–46.0)
HEMOGLOBIN: 14.4 g/dL (ref 12.0–15.0)
Lymphocytes Relative: 26 %
Lymphs Abs: 1.2 10*3/uL (ref 0.7–4.0)
MCH: 30.4 pg (ref 26.0–34.0)
MCHC: 34.6 g/dL (ref 30.0–36.0)
MCV: 87.9 fL (ref 78.0–100.0)
Monocytes Absolute: 0.4 10*3/uL (ref 0.1–1.0)
Monocytes Relative: 7 %
NEUTROS ABS: 3.7 10*3/uL (ref 1.7–7.7)
NEUTROS PCT: 64 %
NRBC: 0 /100{WBCs}
PLATELETS: 238 10*3/uL (ref 150–400)
RBC: 4.73 MIL/uL (ref 3.87–5.11)
RDW: 13 % (ref 11.5–15.5)
WBC: 5.8 10*3/uL (ref 4.0–10.5)

## 2016-05-12 LAB — RAPID STREP SCREEN (MED CTR MEBANE ONLY): Streptococcus, Group A Screen (Direct): NEGATIVE

## 2016-05-12 MED ORDER — OXYMETAZOLINE HCL 0.05 % NA SOLN: 1.0000 | Freq: Two times a day (BID) | NASAL | 0 refills | Status: AC

## 2016-05-12 MED ORDER — ONDANSETRON 4 MG PO TBDP: 4.0000 mg | ORAL_TABLET | Freq: Three times a day (TID) | ORAL | 0 refills | Status: AC | PRN

## 2016-05-12 MED ORDER — LOPERAMIDE HCL 2 MG PO CAPS: 2.0000 mg | ORAL_CAPSULE | Freq: Four times a day (QID) | ORAL | 0 refills | Status: AC | PRN

## 2016-05-12 MED ORDER — DEXAMETHASONE SODIUM PHOSPHATE 10 MG/ML IJ SOLN
10.0000 mg | Freq: Once | INTRAMUSCULAR | Status: AC
  Administered 2016-05-12: 10 mg via INTRAMUSCULAR
  Filled 2016-05-12: qty 1

## 2016-05-12 NOTE — ED Notes (Signed)
PT DISCHARGED. INSTRUCTIONS AND PRESCRIPTIONS GIVEN. AAOX4. PT IN NO APPARENT DISTRESS. THE OPPORTUNITY TO ASK QUESTIONS WAS PROVIDED. 

## 2016-05-12 NOTE — ED Provider Notes (Signed)
[facility]-EMERGENCY DEPT Provider Note   CSN: [id number] Arrival date & time: 05/12/16  [id number]     History   Chief Complaint Chief Complaint  Patient presents with  . Sore Throat  . Diarrhea  . Generalized Body Aches    HPI Tamara Robles is a 25 y.o. female.  Patient is a 25 year old female with history of asthma, diabetes who presents to the ED with multiple complaints. Patient reports over the past 5 days she has had a sore throat associated fever, chills, body aches, nasal congestion, cough (clear sputum) bedside as pressure. She also states she has had nausea with 1-2 episodes of NBNB vomiting daily and approximately 5 episodes of nonbloody diarrhea daily. She notes she has been taking Tylenol and ibuprofen at home without relief. Denies headache, neck stiffness, ear pain, trismus, drooling, voice change, facial/neck swelling, hemoptysis, wheezing, shortness of breath, chest pain, abdominal pain, urinary symptoms, vaginal bleeding, vaginal discharge. LMP 10/10. Denies any known sick contacts. Denies any recent use of antibiotics, recent hospitalizations, drinking out of freshwater sources or recent travel outside of the [place]. Denies history of abdominal surgeries.      Past Medical History:  Diagnosis Date  . Anxiety   . Depression   . Diabetes (HCC) 11/03/2013   Type II  . Endometriosis   . Migraines     Patient Active Problem List   Diagnosis Date Noted  . Encounter for long-term (current) use of other medications 12/03/2013  . NASH (nonalcoholic steatohepatitis) 11/03/2013  . Diabetes (HCC) 11/03/2013  . Migraine headache without aura 10/20/2013    Class: History of  . Obesity, unspecified 10/20/2013    Class: History of    Past Surgical History:  Procedure Laterality Date  . INTRAUTERINE DEVICE (IUD) INSERTION     paraguard iud inserted 02/2013  . WISDOM TOOTH EXTRACTION      OB History    Gravida Para Term Preterm AB Living   0 0 0 0 0 0   SAB TAB Ectopic  Multiple Live Births   0 0 0 0         Home Medications    Prior to Admission medications   Medication Sig Start Date End Date Taking? Authorizing Provider  EPINEPHrine 0.3 mg/0.3 mL IJ SOAJ injection Inject 0.3 mLs (0.3 mg total) into the muscle once. 01/16/16  Yes Tiffany Neva SeatGreene, PA-C  ibuprofen (ADVIL,MOTRIN) 200 MG tablet Take 400-800 mg by mouth every 6 (six) hours as needed for moderate pain.    Yes Historical Provider, MD  metFORMIN (GLUCOPHAGE-XR) 500 MG 24 hr tablet Take 500 mg by mouth 2 (two) times daily.  11/03/13  Yes Romero BellingSean Ellison, MD  NALTREXONE HCL PO Take 4.5 mg by mouth daily.    Yes Historical Provider, MD  loperamide (IMODIUM) 2 MG capsule Take 1 capsule (2 mg total) by mouth 4 (four) times daily as needed for diarrhea or loose stools. 05/12/16   Barrett HenleNicole Elizabeth Nadeau, PA-C  ondansetron (ZOFRAN ODT) 4 MG disintegrating tablet Take 1 tablet (4 mg total) by mouth every 8 (eight) hours as needed for nausea or vomiting. 05/12/16   Barrett HenleNicole Elizabeth Nadeau, PA-C  oxymetazoline (AFRIN NASAL SPRAY) 0.05 % nasal spray Place 1 spray into both nostrils 2 (two) times daily. Do not use for more than 3 days to prevent rebound rhinorrhea 05/12/16   Barrett HenleNicole Elizabeth Nadeau, PA-C    Family History Family History  Problem Relation Age of Onset  . Diabetes Mother   . Hypertension Mother   .  Bipolar disorder Mother   . Breast cancer Maternal Aunt   . Diabetes Paternal Grandfather   . Heart attack Paternal Grandfather   . Breast cancer Maternal Aunt   . Diabetes Brother   . Diabetes Maternal Grandfather     Social History Social History  Substance Use Topics  . Smoking status: Never Smoker  . Smokeless tobacco: Never Used  . Alcohol use No     Allergies   Cherry extract; Fioricet [butalbital-apap-caffeine]; Orange fruit [citrus]; Amoxicillin; and Pork-derived products   Review of Systems Review of Systems  Constitutional: Positive for chills and fever.  HENT: Positive  for congestion, sinus pressure and sore throat.   Respiratory: Positive for cough.   Gastrointestinal: Positive for diarrhea, nausea and vomiting.  Musculoskeletal: Positive for myalgias (generalized).  All other systems reviewed and are negative.    Physical Exam Updated Vital Signs BP 129/80 (BP Location: Right Arm)   Pulse 86   Temp 97.5 F (36.4 C) (Oral)   Resp 14   Ht 5\' 5"  (1.651 m)   Wt 102.1 kg   LMP 04/24/2016   SpO2 99%   BMI 37.44 kg/m   Physical Exam  Constitutional: She is oriented to person, place, and time. She appears well-developed and well-nourished. No distress.  HENT:  Head: Normocephalic and atraumatic.  Right Ear: Tympanic membrane normal.  Left Ear: Tympanic membrane normal.  Nose: Rhinorrhea present. Right sinus exhibits maxillary sinus tenderness. Right sinus exhibits no frontal sinus tenderness. Left sinus exhibits maxillary sinus tenderness. Left sinus exhibits no frontal sinus tenderness.  Mouth/Throat: Uvula is midline, oropharynx is clear and moist and mucous membranes are normal. No oral lesions. No trismus in the jaw. No uvula swelling. No oropharyngeal exudate, posterior oropharyngeal edema, posterior oropharyngeal erythema or tonsillar abscesses. Tonsils are 2+ on the right. Tonsils are 2+ on the left. No tonsillar exudate.  No trismus or drooling, patient tolerating secretions.  Eyes: Conjunctivae and EOM are normal. Right eye exhibits no discharge. Left eye exhibits no discharge. No scleral icterus.  Neck: Normal range of motion. Neck supple.  No facial or neck swelling.  Cardiovascular: Normal rate, regular rhythm, normal heart sounds and intact distal pulses.   Pulmonary/Chest: Effort normal and breath sounds normal. No respiratory distress. She has no wheezes. She has no rales. She exhibits no tenderness.  Abdominal: Soft. Bowel sounds are normal. She exhibits no distension and no mass. There is no tenderness. There is no rebound and no  guarding. No hernia.  Musculoskeletal: Normal range of motion. She exhibits no edema.  Lymphadenopathy:    She has cervical adenopathy (bilateral cervical lymphadenopathy).  Neurological: She is alert and oriented to person, place, and time.  Skin: Skin is warm and dry. She is not diaphoretic.  Nursing note and vitals reviewed.    ED Treatments / Results  Labs (all labs ordered are listed, but only abnormal results are displayed) Labs Reviewed  BASIC METABOLIC PANEL - Abnormal; Notable for the following:       Result Value   CO2 21 (*)    Glucose, Bld 260 (*)    Calcium 8.8 (*)    All other components within normal limits  RAPID STREP SCREEN (NOT AT [facility])  CULTURE, GROUP A STREP ([facility])  CBC WITH DIFFERENTIAL/PLATELET    EKG  EKG Interpretation None       Radiology No results found.  Procedures Procedures (including critical care time)  Medications Ordered in ED Medications  dexamethasone (DECADRON) injection 10 mg (  not administered)     Initial Impression / Assessment and Plan / ED Course  I have reviewed the triage vital signs and the nursing notes.  Pertinent labs & imaging results that were available during my care of the patient were reviewed by me and considered in my medical decision making (see chart for details).  Clinical Course    Pt afebrile without tonsillar exudate, + cervical lymphadenopathy, & dysphagia; strep negative. Suspect viral pharyngitis. Treated in the Ed with steroids, refused NSAIDs.  Pt appears well hydrated, discussed importance of conitnued water rehydration. Presentation non concerning for PTA or infxn spread to soft tissue. No trismus or uvula deviation. Specific return precautions discussed. Pt able to drink water in ED without difficulty with intact air way. Recommended PCP follow up.    Patient with symptoms consistent with viral gastroenteritis.  Vitals are stable, no fever.  No signs of dehydration, tolerating PO fluids > 6 oz.   Lungs are clear.  No focal abdominal pain, no concern for appendicitis, cholecystitis, pancreatitis, ruptured viscus, UTI, kidney stone, or any other abdominal etiology.  Supportive therapy indicated with return if symptoms worsen.  Patient counseled.   Final Clinical Impressions(s) / ED Diagnoses   Final diagnoses:  Pharyngitis, unspecified etiology  Gastroenteritis    New Prescriptions New Prescriptions   LOPERAMIDE (IMODIUM) 2 MG CAPSULE    Take 1 capsule (2 mg total) by mouth 4 (four) times daily as needed for diarrhea or loose stools.   ONDANSETRON (ZOFRAN ODT) 4 MG DISINTEGRATING TABLET    Take 1 tablet (4 mg total) by mouth every 8 (eight) hours as needed for nausea or vomiting.   OXYMETAZOLINE (AFRIN NASAL SPRAY) 0.05 % NASAL SPRAY    Place 1 spray into both nostrils 2 (two) times daily. Do not use for more than 3 days to prevent rebound rhinorrhea     Barrett Henleicole Elizabeth Nadeau, PA-C 05/12/16 1200    Canary Brimhristopher J Tegeler, MD 05/14/16 [phone]

## 2016-05-12 NOTE — ED Triage Notes (Signed)
Patient c/o flu like symptoms since Tuesday. Patient having body aches, sore throat, chills, n/v/d. Patient has taken Tylenol cold and flu as well as ibuprofen without any relief of symptoms.

## 2016-05-12 NOTE — Discharge Instructions (Signed)
Take your medications as prescribed. I recommend continuing to drink fluids at home to remain hydrated. I also recommend eating a bland diet for the next few days until her symptoms have improved. Please follow up with a primary care provider from the Resource Guide provided below in 4-5 days if her symptoms have not improved.  Please return to the Emergency Department if symptoms worsen or new onset of fever, headache, neck stiffness, sensation of throat closing, drooling, unable to swallow, change in voice, difficulty breathing, vomiting, unable to keep fluids down, blood in emesis or stool.

## 2016-05-14 LAB — CULTURE, GROUP A STREP (THRC)

## 2016-07-20 ENCOUNTER — Ambulatory Visit: Payer: 59 | Admitting: Women's Health

## 2016-07-20 DIAGNOSIS — Z0289 Encounter for other administrative examinations: Secondary | ICD-10-CM

## 2016-07-29 ENCOUNTER — Emergency Department (HOSPITAL_COMMUNITY)
Admission: EM | Admit: 2016-07-29 | Discharge: 2016-07-30 | Disposition: A | Discharge status: Home / Self Care | Payer: 59 | Attending: Emergency Medicine | Admitting: Emergency Medicine

## 2016-07-29 ENCOUNTER — Emergency Department (HOSPITAL_COMMUNITY): Payer: 59

## 2016-07-29 ENCOUNTER — Other Ambulatory Visit: Payer: Self-pay

## 2016-07-29 DIAGNOSIS — Z7984 Long term (current) use of oral hypoglycemic drugs: Secondary | ICD-10-CM | POA: Diagnosis not present

## 2016-07-29 DIAGNOSIS — R000 Tachycardia, unspecified: Secondary | ICD-10-CM | POA: Insufficient documentation

## 2016-07-29 DIAGNOSIS — E119 Type 2 diabetes mellitus without complications: Secondary | ICD-10-CM | POA: Diagnosis not present

## 2016-07-29 LAB — CBG MONITORING, ED: GLUCOSE-CAPILLARY: 341 mg/dL — AB (ref 65–99)

## 2016-07-29 LAB — CBC
HCT: 46.9 % — ABNORMAL HIGH (ref 36.0–46.0)
HEMOGLOBIN: 16.8 g/dL — AB (ref 12.0–15.0)
MCH: 31.6 pg (ref 26.0–34.0)
MCHC: 35.8 g/dL (ref 30.0–36.0)
MCV: 88.3 fL (ref 78.0–100.0)
Platelets: 264 10*3/uL (ref 150–400)
RBC: 5.31 MIL/uL — ABNORMAL HIGH (ref 3.87–5.11)
RDW: 12.7 % (ref 11.5–15.5)
WBC: 8.5 10*3/uL (ref 4.0–10.5)

## 2016-07-29 LAB — BASIC METABOLIC PANEL
ANION GAP: 11 (ref 5–15)
BUN: 13 mg/dL (ref 6–20)
CALCIUM: 9.7 mg/dL (ref 8.9–10.3)
CO2: 23 mmol/L (ref 22–32)
Chloride: 102 mmol/L (ref 101–111)
Creatinine, Ser: 0.79 mg/dL (ref 0.44–1.00)
GFR calc Af Amer: >60 mL/min (ref >60)
Glucose, Bld: 328 mg/dL — ABNORMAL HIGH (ref 65–99)
Potassium: 3.8 mmol/L (ref 3.5–5.1)
Sodium: 136 mmol/L (ref 135–145)

## 2016-07-29 LAB — TROPONIN I

## 2016-07-29 MED ORDER — SODIUM CHLORIDE 0.9 % IV BOLUS (SEPSIS)
1000.0000 mL | Freq: Once | INTRAVENOUS | Status: AC
  Administered 2016-07-29: 1000 mL via INTRAVENOUS

## 2016-07-29 NOTE — ED Notes (Signed)
ED Provider at bedside. 

## 2016-07-29 NOTE — ED Triage Notes (Signed)
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  No previous history no pain.  Heart rate slowing initial here was 120  lmp nolne iub

## 2016-07-29 NOTE — ED Provider Notes (Signed)
[facility]-EMERGENCY DEPT Provider Note   CSN: [id number] Arrival date & time: 07/29/16  2204  By signing my name below, I, Vista Minkobert Ross, attest that this documentation has been prepared under the direction and in the presence of Shon Batonourtney F Vitali Seibert, MD. Electronically signed, Vista Minkobert Ross, ED Scribe. 07/30/16. 12:18 AM.   History   Chief Complaint Chief Complaint  Patient presents with  . Tachycardia    HPI HPI Comments: Tamara Robles is a 26 y.o. female, with Hx of anxiety, DM, who presents to the Emergency Department with concerns for gradual onset tachycardia and fatigue that started this evening. 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 She has no Hx of tachycardia. She states that she started feeling "weird" around 1700 this evening. She states that she had a feeling of nausea and also had a burning sensation in epigastric region. She is still currently feeling nauseas but the burning sensation has resolved. She checked her heart rate once she started to feel it increasing at reports that it was fluctuating between 140 and 130. She states "it feels like my chest is fluttering". She did feel mildly short of breath during this episode but not currently. Upon standing she felt like her legs were going to give out, she denies falling or syncopal episode. Pt drank a diet coke on her way to work this evening but states that this is a normal routine for her. She also notes that her CBG has been high recently, last checked approximately 300. No recent increase in caffeine intake. No new dietary supplements. No Hx of SVT. No Hx of thyroid disease. No leg swelling. No recent long distance travel.  The history is provided by the patient. No language interpreter was used.    Past Medical History:  Diagnosis Date  . Anxiety   . Depression   . Diabetes (HCC) 11/03/2013   Type II  . Endometriosis   . Migraines     Patient Active Problem List     Diagnosis Date Noted  . Encounter for long-term (current) use of other medications 12/03/2013  . NASH (nonalcoholic steatohepatitis) 11/03/2013  . Diabetes (HCC) 11/03/2013  . Migraine headache without aura 10/20/2013    Class: History of  . Obesity, unspecified 10/20/2013    Class: History of    Past Surgical History:  Procedure Laterality Date  . INTRAUTERINE DEVICE (IUD) INSERTION     paraguard iud inserted 02/2013  . WISDOM TOOTH EXTRACTION      OB History    Gravida Para Term Preterm AB Living   0 0 0 0 0 0   SAB TAB Ectopic Multiple Live Births   0 0 0 0         Home Medications    Prior to Admission medications   Medication Sig Start Date End Date Taking? Authorizing Provider  EPINEPHrine 0.3 mg/0.3 mL IJ SOAJ injection Inject 0.3 mLs (0.3 mg total) into the muscle once. 01/16/16  Yes Marlon Peliffany Greene, PA-C  levonorgestrel (MIRENA) 20 MCG/24HR IUD 1 each by Intrauterine route once.   Yes Historical Provider, MD  metFORMIN (GLUCOPHAGE-XR) 500 MG 24 hr tablet Take 500 mg by mouth 2 (two) times daily.  11/03/13  Yes Romero BellingSean Ellison, MD  NALTREXONE HCL PO Take 4.5 mg by mouth daily.    Yes Historical Provider, MD  loperamide (IMODIUM) 2 MG capsule Take 1 capsule (2 mg total) by mouth 4 (four) times daily as needed for  diarrhea or loose stools. Patient not taking: Reported on 07/30/2016 05/12/16   Barrett Henle, PA-C  ondansetron (ZOFRAN ODT) 4 MG disintegrating tablet Take 1 tablet (4 mg total) by mouth every 8 (eight) hours as needed for nausea or vomiting. Patient not taking: Reported on 07/30/2016 05/12/16   Barrett Henle, PA-C  oxymetazoline [facility] NASAL SPRAY) 0.05 % nasal spray Place 1 spray into both nostrils 2 (two) times daily. Do not use for more than 3 days to prevent rebound rhinorrhea Patient not taking: Reported on 07/30/2016 05/12/16   Barrett Henle, PA-C    Family History Family History  Problem Relation Age of Onset  . Diabetes  Mother   . Hypertension Mother   . Bipolar disorder Mother   . Breast cancer Maternal Aunt   . Diabetes Paternal Grandfather   . Heart attack Paternal Grandfather   . Breast cancer Maternal Aunt   . Diabetes Brother   . Diabetes Maternal Grandfather     Social History Social History  Substance Use Topics  . Smoking status: Never Smoker  . Smokeless tobacco: Never Used  . Alcohol use No     Allergies   Cherry extract; Fioricet [butalbital-apap-caffeine]; Orange fruit [citrus]; Amoxicillin; and Pork-derived products   Review of Systems Review of Systems  Constitutional: Negative for fever.  Respiratory: Positive for shortness of breath (resolved). Negative for chest tightness.   Cardiovascular: Positive for palpitations. Negative for leg swelling.  Gastrointestinal: Positive for nausea. Negative for abdominal pain (epigastric (resolved)).  All other systems reviewed and are negative.    Physical Exam Updated Vital Signs BP 129/74   Pulse 84   Temp 97.8 F (36.6 C)   Resp 18   Ht 5\' 6"  (1.676 m)   Wt 219 lb (99.3 kg)   SpO2 99%   BMI 35.35 kg/m   Physical Exam  Constitutional: She is oriented to person, place, and time. She appears well-developed and well-nourished. No distress.  HENT:  Head: Normocephalic and atraumatic.  Cardiovascular: Regular rhythm and normal heart sounds.   Tachycardia  Pulmonary/Chest: Effort normal. No respiratory distress. She has no wheezes.  Abdominal: Soft. Bowel sounds are normal. There is no tenderness. There is no guarding.  Musculoskeletal: She exhibits no edema.  Neurological: She is alert and oriented to person, place, and time.  Skin: Skin is warm and dry.  Psychiatric: She has a normal mood and affect.  Nursing note and vitals reviewed.    ED Treatments / Results  DIAGNOSTIC STUDIES: Oxygen Saturation is 99% on RA, normal by my interpretation.  COORDINATION OF CARE: 12:18 AM-Discussed treatment plan with pt at  bedside and pt agreed to plan.   Labs (all labs ordered are listed, but only abnormal results are displayed) Labs Reviewed  BASIC METABOLIC PANEL - Abnormal; Notable for the following:       Result Value   Glucose, Bld 328 (*)    All other components within normal limits  CBC - Abnormal; Notable for the following:    RBC 5.31 (*)    Hemoglobin 16.8 (*)    HCT 46.9 (*)    All other components within normal limits  CBG MONITORING, ED - Abnormal; Notable for the following:    Glucose-Capillary 341 (*)    All other components within normal limits  CBG MONITORING, ED - Abnormal; Notable for the following:    Glucose-Capillary 234 (*)    All other components within normal limits  TROPONIN I  TSH  D-DIMER,  QUANTITATIVE (NOT AT [facility])  I-STAT BETA HCG BLOOD, ED ([facility], [facility], [facility] ONLY)    EKG  EKG Interpretation  Date/Time:  Sunday July 29 2016 22:05:13 EST Ventricular Rate:  106 PR Interval:  138 QRS Duration: 72 QT Interval:  314 QTC Calculation: 417 R Axis:   93 Text Interpretation:  Sinus tachycardia Rightward axis Nonspecific T wave abnormality Abnormal ECG No prior for comparison Confirmed by Devetta Hagenow  MD, Belvia Gotschall ([id number]) on 07/29/2016 11:07:52 PM       EKG Interpretation  Date/Time:  Sunday July 29 2016 23:09:19 EST Ventricular Rate:  137 PR Interval:  138 QRS Duration: 79 QT Interval:  303 QTC Calculation: 458 R Axis:   97 Text Interpretation:  Sinus tachycardia Consider RVH w/ secondary repol abnormality Confirmed by Wilkie Aye  MD, Toni Amend ([id number]) on 07/30/2016 3:15:50 AM        Radiology Dg Chest 2 View  Result Date: 07/29/2016 CLINICAL DATA:  Sudden onset of tachycardia just prior to arrival. EXAM: CHEST  2 VIEW COMPARISON:  None. FINDINGS: The cardiomediastinal contours are normal. The lungs are clear. Pulmonary vasculature is normal. No consolidation, pleural effusion, or pneumothorax. No acute osseous abnormalities are seen. Bilateral nipple rings noted.  IMPRESSION: No acute pulmonary process. Electronically Signed   By: Rubye Oaks M.D.   On: 07/29/2016 22:59    Procedures Procedures (including critical care time)  Medications Ordered in ED Medications  sodium chloride 0.9 % bolus 1,000 mL (0 mLs Intravenous Stopped 07/30/16 0100)  sodium chloride 0.9 % bolus 1,000 mL (0 mLs Intravenous Stopped 07/30/16 0223)  ibuprofen (ADVIL,MOTRIN) tablet 400 mg (400 mg Oral Given 07/30/16 0222)     Initial Impression / Assessment and Plan / ED Course  I have reviewed the triage vital signs and the nursing notes.  Pertinent labs & imaging results that were available during my care of the patient were reviewed by me and considered in my medical decision making (see chart for details).  Clinical Course     Patient presents with tachycardia. Reports she felt well until 5 PM yesterday. Initial EKG with a heart rate in the 100s. However, when patient was brought back to the room it was 150s. It appears sinus. With sitting, patient settles out in the low 100s. No history of the same. TSH negative. D-dimer negative. She is hyperglycemic with a history of diabetes. She was given a total of 2 L of fluid. Orthostatics technically negative; however, patient was noted to have several episodes of increased heart rate after ambulation to the bathroom. She notes generalized weakness and palpitations with these episodes.  Acute emergent condition has been ruled out; however, feel patient likely needs to follow-up with cardiology for repeat evaluation and Holter monitoring.  After history, exam, and medical workup I feel the patient has been appropriately medically screened and is safe for discharge home. Pertinent diagnoses were discussed with the patient. Patient was given return precautions.   Final Clinical Impressions(s) / ED Diagnoses   Final diagnoses:  Tachycardia    New Prescriptions New Prescriptions   No medications on file  I personally performed  the services described in this documentation, which was scribed in my presence. The recorded information has been reviewed and is accurate.     Shon Baton, MD 07/30/16 [phone]

## 2016-07-30 DIAGNOSIS — E119 Type 2 diabetes mellitus without complications: Secondary | ICD-10-CM | POA: Diagnosis not present

## 2016-07-30 DIAGNOSIS — R000 Tachycardia, unspecified: Secondary | ICD-10-CM | POA: Diagnosis not present

## 2016-07-30 DIAGNOSIS — Z7984 Long term (current) use of oral hypoglycemic drugs: Secondary | ICD-10-CM | POA: Diagnosis not present

## 2016-07-30 LAB — CBG MONITORING, ED: Glucose-Capillary: 234 mg/dL — ABNORMAL HIGH (ref 65–99)

## 2016-07-30 LAB — D-DIMER, QUANTITATIVE: D-Dimer, Quant: 0.28 ug/mL-FEU (ref 0.00–0.50)

## 2016-07-30 LAB — I-STAT BETA HCG BLOOD, ED (MC, WL, AP ONLY)

## 2016-07-30 LAB — TSH: TSH: 2.325 u[IU]/mL (ref 0.350–4.500)

## 2016-07-30 MED ORDER — IBUPROFEN 400 MG PO TABS
400.0000 mg | ORAL_TABLET | Freq: Once | ORAL | Status: AC
  Administered 2016-07-30: 400 mg via ORAL
  Filled 2016-07-30: qty 1

## 2016-07-30 MED ORDER — SODIUM CHLORIDE 0.9 % IV BOLUS (SEPSIS)
1000.0000 mL | Freq: Once | INTRAVENOUS | Status: AC
  Administered 2016-07-30: 1000 mL via INTRAVENOUS

## 2016-07-30 NOTE — ED Notes (Signed)
Ambulated with Pt to bathroom, Pt did fine while walking to the bathroom. Pt felt more weak as she was walking back. Hooked pt back up to monitor in room and pt's heart rate was 148 at a normal rhythm. Pt currently resting and heart rate is 96.

## 2016-07-30 NOTE — Discharge Instructions (Signed)
You were seen today for palpitations and increased heart rate. Your workup is largely reassuring. You did have episodes of increased heart rate mostly with ambulation and standing; however, you are not orthostatic. You were given fluids. You need follow-up with cardiology for reevaluation and likely Holter monitoring.

## 2016-07-30 NOTE — ED Notes (Signed)
IV Fluids are complete

## 2016-09-30 IMAGING — CT CT ABD-PELV W/ CM
2 of 4 series · 16 of 46 positions shown, 18 images · IV contrast (iopamidol)
Comparison: Pelvic ultrasound performed 07/22/2014, and CT of the
abdomen and pelvis performed 07/08/2014

CLINICAL DATA: Acute onset of right lower quadrant abdominal pain
and nausea. Initial encounter.

EXAM:
CT ABDOMEN AND PELVIS WITH CONTRAST
TECHNIQUE: Multidetector CT imaging of the abdomen and pelvis was performed
using the standard protocol following bolus administration of
intravenous contrast.
CONTRAST:  100mL A5WYHQ-7LL IOPAMIDOL (A5WYHQ-7LL) INJECTION 61%

[Series 2: abd/pel with · axial · 0.74mm/px · z∈[-329,+101]mm · 13 of 94 slices shown, 15 images]
[im 4/94  soft-tissue]
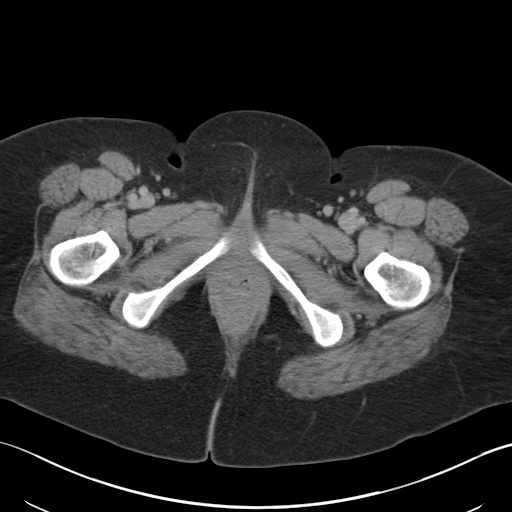
[im 4/94  bone]
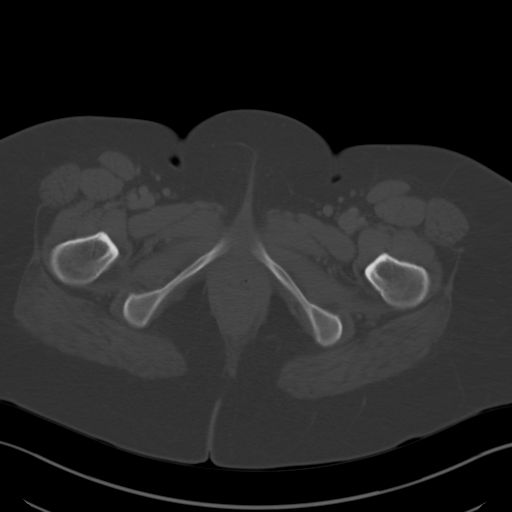
[im 12/94  soft-tissue]
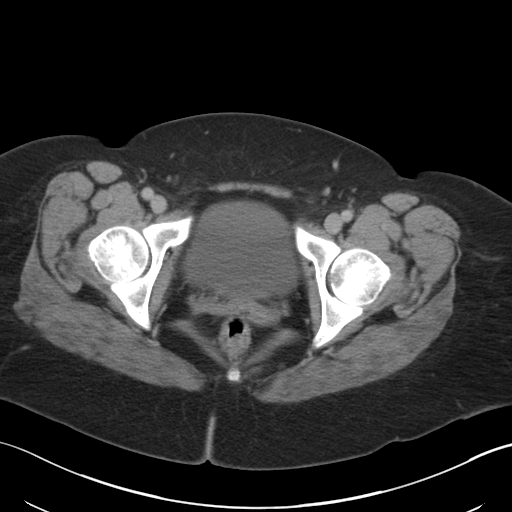
[im 19/94  soft-tissue]
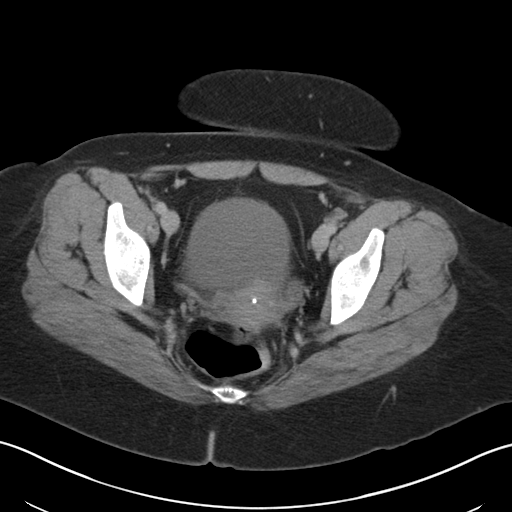
[im 27/94  soft-tissue]
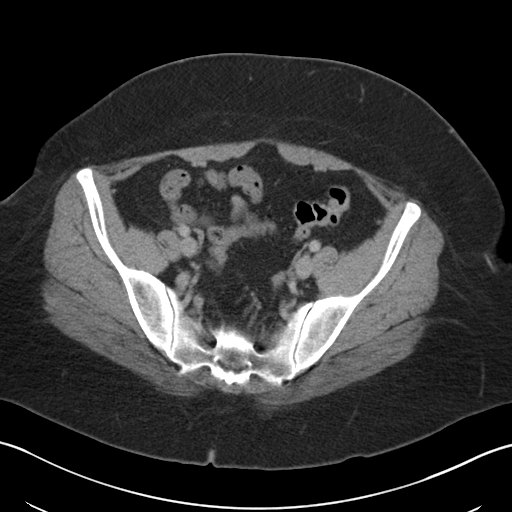
[im 34/94  soft-tissue]
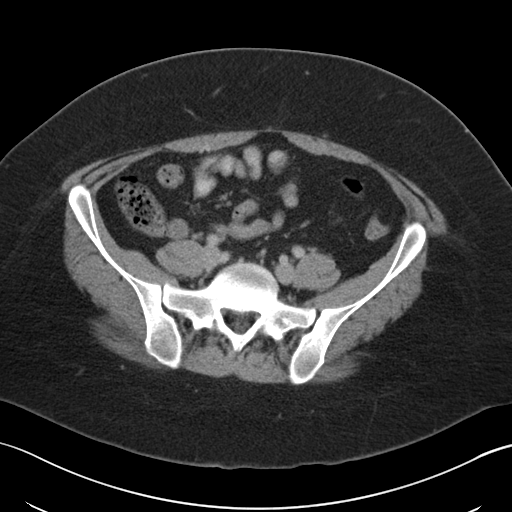
[im 41/94  soft-tissue]
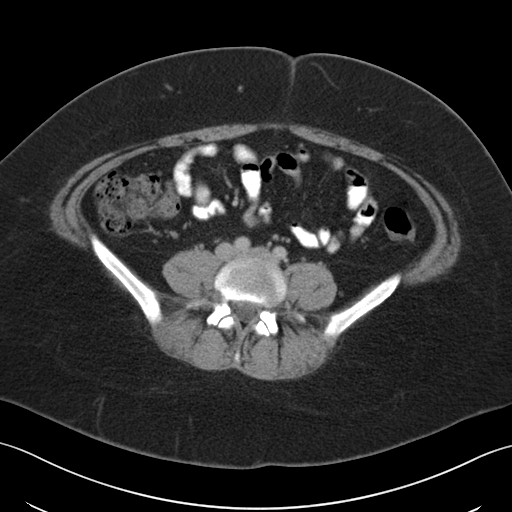
[im 49/94  soft-tissue]
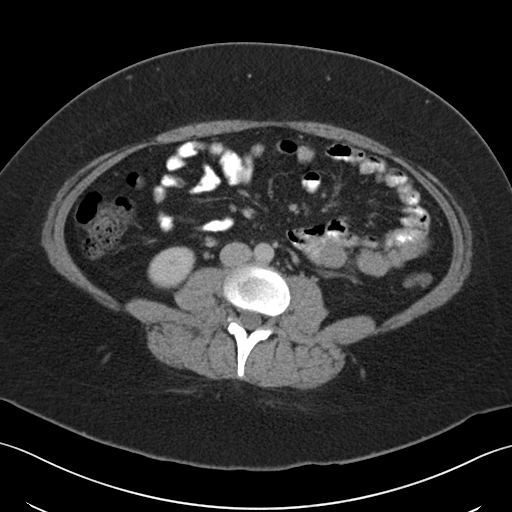
[im 53/94  soft-tissue]
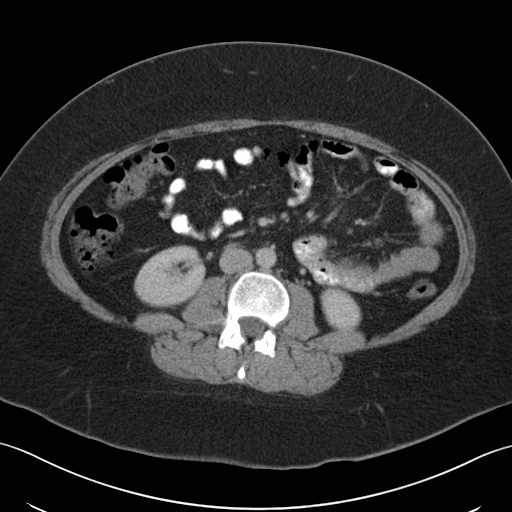
[im 60/94  soft-tissue]
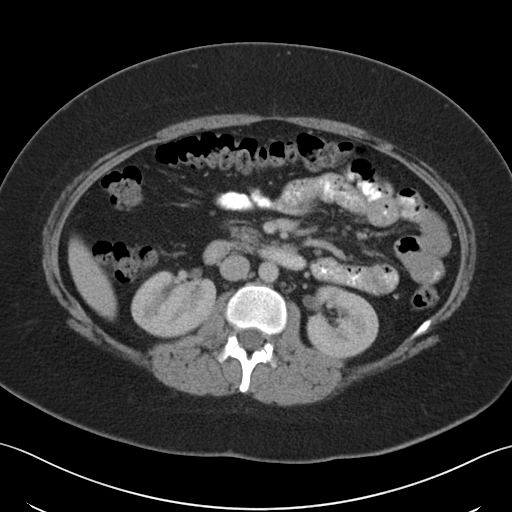
[im 60/94  bone]
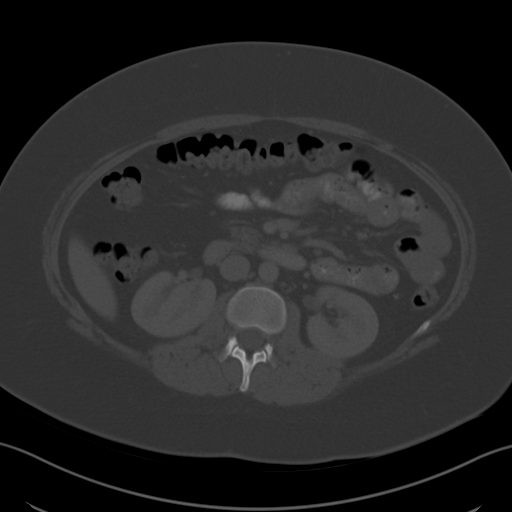
[im 67/94  soft-tissue]
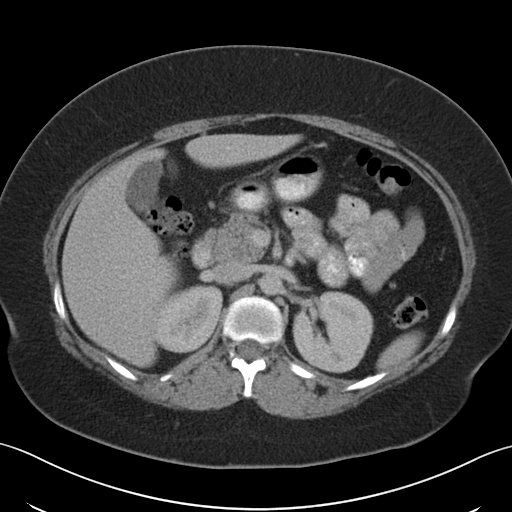
[im 75/94  soft-tissue]
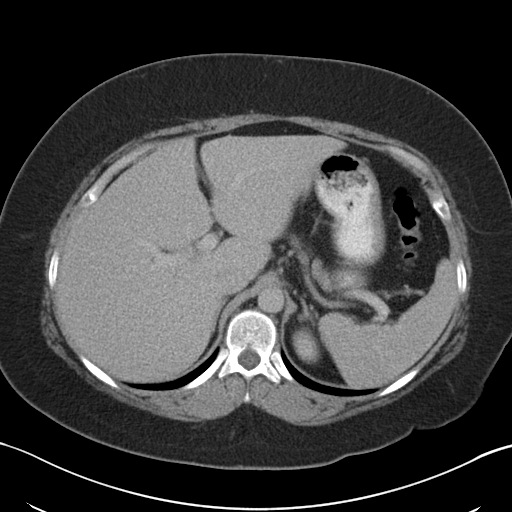
[im 82/94  soft-tissue]
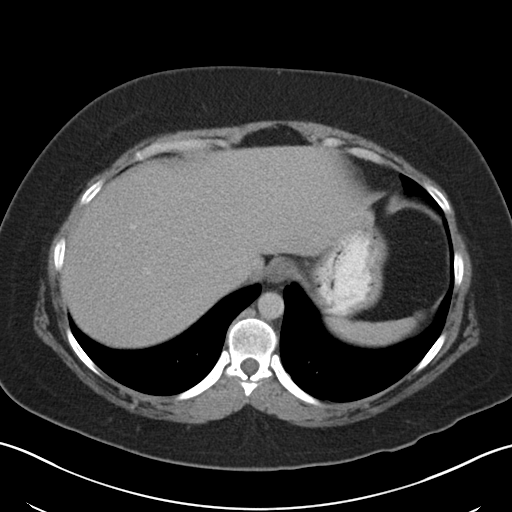
[im 90/94  soft-tissue]
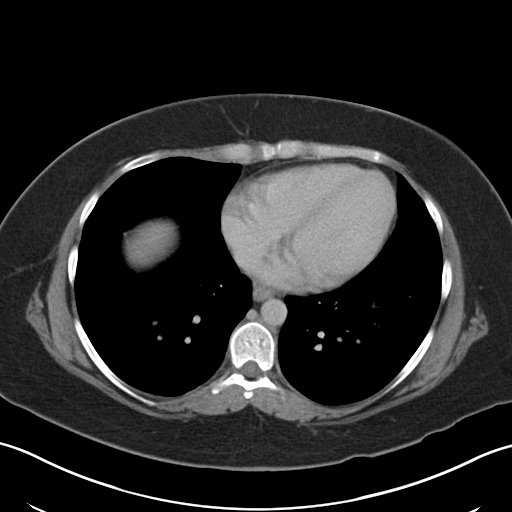

[Series 4: coronal a/|p · coronal · 0.74mm/px · 3 of 88 slices shown]
[im 30/88  soft-tissue]
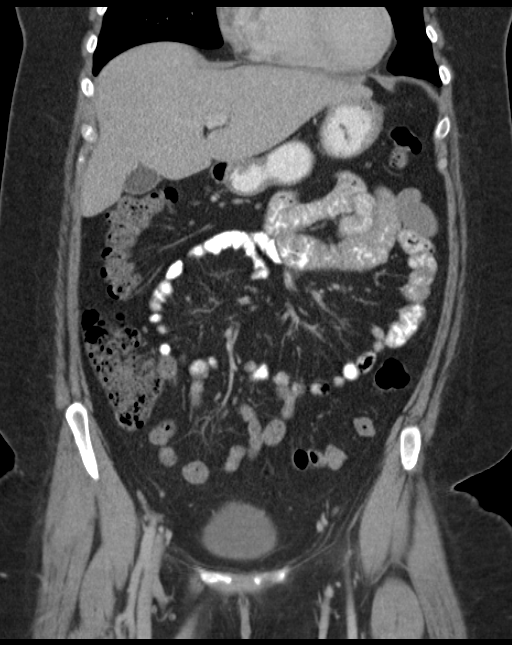
[im 39/88  soft-tissue]
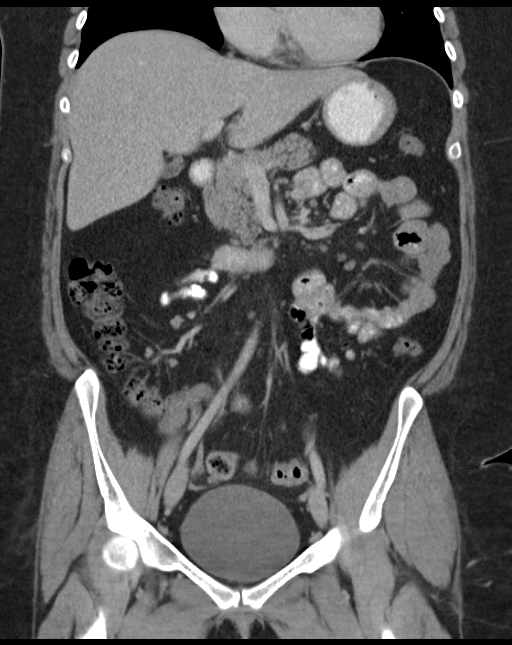
[im 49/88  soft-tissue]
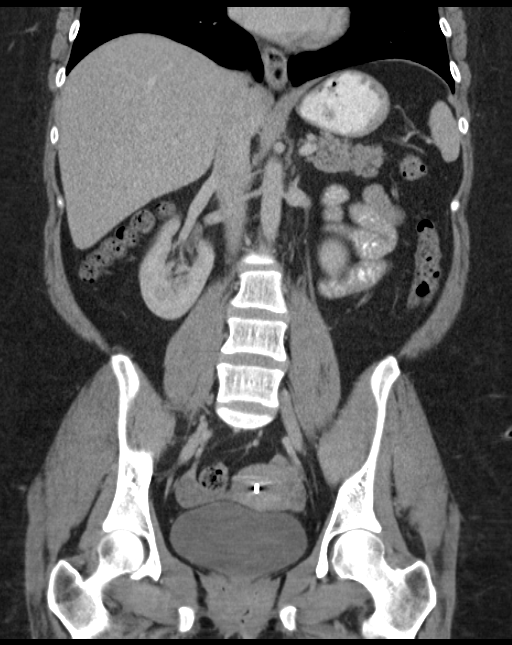

[16 of 46 positions shown; findings below may reference images not displayed]

FINDINGS: The visualized lung bases are clear.

The liver and spleen are unremarkable in appearance. A small stone
is noted dependently within the gallbladder. The gallbladder is
otherwise unremarkable. The pancreas and adrenal glands are
unremarkable.

The kidneys are unremarkable in appearance. There is no evidence of
hydronephrosis. No renal or ureteral stones are seen. No perinephric
stranding is appreciated.

No free fluid is identified. The small bowel is unremarkable in
appearance. The stomach is within normal limits. No acute vascular
abnormalities are seen.

The appendix is decompressed and difficult to fully assess due to
adjacent small bowel, but appears grossly unremarkable, without
evidence of appendicitis. The colon is unremarkable in appearance.

The bladder is mildly distended and grossly unremarkable. The
patient's intrauterine device is noted in expected position at the
fundus of the uterus. The uterus is unremarkable in appearance. The
ovaries are relatively symmetric. No suspicious adnexal masses are
seen. No inguinal lymphadenopathy is seen.

No acute osseous abnormalities are identified.
IMPRESSION: 1. No acute abnormality seen within the abdomen or pelvis.
2. Cholelithiasis.  Gallbladder otherwise unremarkable.
